# Patient Record
Sex: Female | Born: 1975
Health system: Southern US, Community
[De-identification: ages and names within clinical notes are randomized; demographics above are authoritative.]

## PROBLEM LIST (undated history)

## (undated) ENCOUNTER — Inpatient Hospital Stay (HOSPITAL_COMMUNITY): Payer: Self-pay

## (undated) DIAGNOSIS — K0889 Other specified disorders of teeth and supporting structures: Secondary | ICD-10-CM

## (undated) DIAGNOSIS — M25461 Effusion, right knee: Secondary | ICD-10-CM

## (undated) DIAGNOSIS — N200 Calculus of kidney: Secondary | ICD-10-CM

## (undated) DIAGNOSIS — O34219 Maternal care for unspecified type scar from previous cesarean delivery: Secondary | ICD-10-CM

## (undated) DIAGNOSIS — I8393 Asymptomatic varicose veins of bilateral lower extremities: Secondary | ICD-10-CM

## (undated) DIAGNOSIS — Z5189 Encounter for other specified aftercare: Secondary | ICD-10-CM

## (undated) DIAGNOSIS — S83289A Other tear of lateral meniscus, current injury, unspecified knee, initial encounter: Secondary | ICD-10-CM

## (undated) HISTORY — DX: Asymptomatic varicose veins of bilateral lower extremities: I83.93

## (undated) HISTORY — DX: Effusion, right knee: M25.461

## (undated) HISTORY — DX: Other specified disorders of teeth and supporting structures: K08.89

## (undated) HISTORY — DX: Other tear of lateral meniscus, current injury, unspecified knee, initial encounter: S83.289A

## (undated) HISTORY — DX: Calculus of kidney: N20.0

## (undated) HISTORY — PX: APPENDECTOMY: SHX54

---

## 2000-10-23 ENCOUNTER — Emergency Department (HOSPITAL_COMMUNITY): Admission: EM | Admit: 2000-10-23 | Discharge: 2000-10-23 | Payer: Self-pay | Admitting: Emergency Medicine

## 2002-11-08 ENCOUNTER — Encounter: Payer: Self-pay | Admitting: Emergency Medicine

## 2002-11-08 ENCOUNTER — Ambulatory Visit (HOSPITAL_COMMUNITY): Admission: AD | Admit: 2002-11-08 | Discharge: 2002-11-08 | Payer: Self-pay | Admitting: Gynecology

## 2002-11-08 ENCOUNTER — Encounter (INDEPENDENT_AMBULATORY_CARE_PROVIDER_SITE_OTHER): Payer: Self-pay | Admitting: *Deleted

## 2003-05-22 ENCOUNTER — Inpatient Hospital Stay (HOSPITAL_COMMUNITY): Admission: AD | Admit: 2003-05-22 | Discharge: 2003-05-22 | Payer: Self-pay | Admitting: Obstetrics and Gynecology

## 2003-06-03 ENCOUNTER — Inpatient Hospital Stay (HOSPITAL_COMMUNITY): Admission: AD | Admit: 2003-06-03 | Discharge: 2003-06-03 | Payer: Self-pay | Admitting: Obstetrics and Gynecology

## 2003-07-26 ENCOUNTER — Inpatient Hospital Stay (HOSPITAL_COMMUNITY): Admission: AD | Admit: 2003-07-26 | Discharge: 2003-07-26 | Payer: Self-pay | Admitting: *Deleted

## 2003-07-28 ENCOUNTER — Encounter (INDEPENDENT_AMBULATORY_CARE_PROVIDER_SITE_OTHER): Payer: Self-pay | Admitting: Specialist

## 2003-07-28 ENCOUNTER — Inpatient Hospital Stay (HOSPITAL_COMMUNITY): Admission: AD | Admit: 2003-07-28 | Discharge: 2003-07-28 | Payer: Self-pay | Admitting: *Deleted

## 2003-07-30 ENCOUNTER — Inpatient Hospital Stay (HOSPITAL_COMMUNITY): Admission: AD | Admit: 2003-07-30 | Discharge: 2003-07-30 | Payer: Self-pay | Admitting: Family Medicine

## 2003-08-04 ENCOUNTER — Encounter: Admission: RE | Admit: 2003-08-04 | Discharge: 2003-08-04 | Payer: Self-pay | Admitting: *Deleted

## 2003-08-26 ENCOUNTER — Encounter: Admission: RE | Admit: 2003-08-26 | Discharge: 2003-08-26 | Payer: Self-pay | Admitting: *Deleted

## 2003-08-27 ENCOUNTER — Ambulatory Visit (HOSPITAL_COMMUNITY): Admission: RE | Admit: 2003-08-27 | Discharge: 2003-08-27 | Payer: Self-pay | Admitting: *Deleted

## 2003-09-09 ENCOUNTER — Encounter: Admission: RE | Admit: 2003-09-09 | Discharge: 2003-09-09 | Payer: Self-pay | Admitting: *Deleted

## 2003-09-23 ENCOUNTER — Encounter: Admission: RE | Admit: 2003-09-23 | Discharge: 2003-09-23 | Payer: Self-pay | Admitting: *Deleted

## 2003-10-07 ENCOUNTER — Encounter: Admission: RE | Admit: 2003-10-07 | Discharge: 2003-10-07 | Payer: Self-pay | Admitting: *Deleted

## 2003-10-21 ENCOUNTER — Encounter: Admission: RE | Admit: 2003-10-21 | Discharge: 2003-10-21 | Payer: Self-pay | Admitting: *Deleted

## 2003-11-04 ENCOUNTER — Encounter: Admission: RE | Admit: 2003-11-04 | Discharge: 2003-11-04 | Payer: Self-pay | Admitting: *Deleted

## 2003-11-18 ENCOUNTER — Encounter: Admission: RE | Admit: 2003-11-18 | Discharge: 2003-11-18 | Payer: Self-pay | Admitting: *Deleted

## 2003-12-02 ENCOUNTER — Encounter: Admission: RE | Admit: 2003-12-02 | Discharge: 2003-12-02 | Payer: Self-pay | Admitting: *Deleted

## 2003-12-02 ENCOUNTER — Inpatient Hospital Stay (HOSPITAL_COMMUNITY): Admission: AD | Admit: 2003-12-02 | Discharge: 2003-12-04 | Payer: Self-pay | Admitting: Obstetrics and Gynecology

## 2003-12-09 ENCOUNTER — Encounter: Admission: RE | Admit: 2003-12-09 | Discharge: 2003-12-09 | Payer: Self-pay | Admitting: *Deleted

## 2003-12-16 ENCOUNTER — Encounter: Admission: RE | Admit: 2003-12-16 | Discharge: 2003-12-16 | Payer: Self-pay | Admitting: Obstetrics and Gynecology

## 2003-12-22 ENCOUNTER — Inpatient Hospital Stay (HOSPITAL_COMMUNITY): Admission: AD | Admit: 2003-12-22 | Discharge: 2003-12-24 | Payer: Self-pay | Admitting: *Deleted

## 2006-08-21 ENCOUNTER — Ambulatory Visit: Payer: Self-pay | Admitting: Internal Medicine

## 2006-08-23 ENCOUNTER — Ambulatory Visit (HOSPITAL_COMMUNITY): Admission: RE | Admit: 2006-08-23 | Discharge: 2006-08-23 | Payer: Self-pay | Admitting: Internal Medicine

## 2006-08-24 ENCOUNTER — Ambulatory Visit: Payer: Self-pay | Admitting: Internal Medicine

## 2006-08-24 ENCOUNTER — Ambulatory Visit: Payer: Self-pay | Admitting: *Deleted

## 2006-09-11 ENCOUNTER — Ambulatory Visit: Payer: Self-pay | Admitting: Internal Medicine

## 2006-09-24 ENCOUNTER — Ambulatory Visit: Payer: Self-pay | Admitting: Family Medicine

## 2006-09-26 ENCOUNTER — Ambulatory Visit (HOSPITAL_COMMUNITY): Admission: RE | Admit: 2006-09-26 | Discharge: 2006-09-26 | Payer: Self-pay | Admitting: Internal Medicine

## 2006-09-28 ENCOUNTER — Ambulatory Visit: Payer: Self-pay | Admitting: Internal Medicine

## 2006-11-19 ENCOUNTER — Ambulatory Visit: Admission: RE | Admit: 2006-11-19 | Discharge: 2006-11-19 | Payer: Self-pay | Admitting: Urology

## 2006-12-21 ENCOUNTER — Encounter (INDEPENDENT_AMBULATORY_CARE_PROVIDER_SITE_OTHER): Payer: Self-pay | Admitting: Urology

## 2006-12-21 ENCOUNTER — Inpatient Hospital Stay (HOSPITAL_COMMUNITY): Admission: RE | Admit: 2006-12-21 | Discharge: 2006-12-23 | Payer: Self-pay | Admitting: Urology

## 2006-12-21 HISTORY — PX: NEPHROLITHOTOMY: SUR881

## 2006-12-27 ENCOUNTER — Ambulatory Visit (HOSPITAL_COMMUNITY): Admission: RE | Admit: 2006-12-27 | Discharge: 2006-12-27 | Payer: Self-pay | Admitting: Urology

## 2007-02-18 ENCOUNTER — Ambulatory Visit (HOSPITAL_COMMUNITY): Admission: RE | Admit: 2007-02-18 | Discharge: 2007-02-18 | Payer: Self-pay | Admitting: Urology

## 2007-02-20 ENCOUNTER — Inpatient Hospital Stay (HOSPITAL_COMMUNITY): Admission: RE | Admit: 2007-02-20 | Discharge: 2007-02-25 | Payer: Self-pay | Admitting: Urology

## 2007-02-20 HISTORY — PX: KIDNEY STONE SURGERY: SHX686

## 2007-02-27 ENCOUNTER — Ambulatory Visit (HOSPITAL_COMMUNITY): Admission: RE | Admit: 2007-02-27 | Discharge: 2007-02-27 | Payer: Self-pay | Admitting: Urology

## 2007-03-19 ENCOUNTER — Ambulatory Visit (HOSPITAL_COMMUNITY): Admission: RE | Admit: 2007-03-19 | Discharge: 2007-03-19 | Payer: Self-pay | Admitting: Urology

## 2007-03-19 HISTORY — PX: KIDNEY STONE SURGERY: SHX686

## 2007-03-27 ENCOUNTER — Encounter (INDEPENDENT_AMBULATORY_CARE_PROVIDER_SITE_OTHER): Payer: Self-pay | Admitting: *Deleted

## 2007-07-11 DIAGNOSIS — N200 Calculus of kidney: Secondary | ICD-10-CM

## 2007-07-11 DIAGNOSIS — Z5189 Encounter for other specified aftercare: Secondary | ICD-10-CM

## 2007-07-11 DIAGNOSIS — IMO0001 Reserved for inherently not codable concepts without codable children: Secondary | ICD-10-CM

## 2007-07-11 HISTORY — PX: KIDNEY SURGERY: SHX687

## 2007-07-11 HISTORY — DX: Encounter for other specified aftercare: Z51.89

## 2007-07-11 HISTORY — DX: Calculus of kidney: N20.0

## 2007-07-11 HISTORY — DX: Reserved for inherently not codable concepts without codable children: IMO0001

## 2007-09-06 ENCOUNTER — Ambulatory Visit: Payer: Self-pay | Admitting: Internal Medicine

## 2007-09-06 DIAGNOSIS — M25469 Effusion, unspecified knee: Secondary | ICD-10-CM | POA: Insufficient documentation

## 2007-09-06 LAB — CONVERTED CEMR LAB: Crystals, Fluid: NONE SEEN

## 2007-09-07 ENCOUNTER — Encounter (INDEPENDENT_AMBULATORY_CARE_PROVIDER_SITE_OTHER): Payer: Self-pay | Admitting: Internal Medicine

## 2007-09-10 ENCOUNTER — Ambulatory Visit (HOSPITAL_COMMUNITY): Admission: RE | Admit: 2007-09-10 | Discharge: 2007-09-10 | Payer: Self-pay | Admitting: Internal Medicine

## 2007-09-26 ENCOUNTER — Ambulatory Visit: Payer: Self-pay | Admitting: Internal Medicine

## 2007-09-26 DIAGNOSIS — K089 Disorder of teeth and supporting structures, unspecified: Secondary | ICD-10-CM | POA: Insufficient documentation

## 2007-10-03 ENCOUNTER — Ambulatory Visit: Payer: Self-pay | Admitting: Internal Medicine

## 2007-10-03 LAB — CONVERTED CEMR LAB
Anti Nuclear Antibody(ANA): NEGATIVE
Rhuematoid fact SerPl-aCnc: <20 intl units/mL (ref 0–20)

## 2007-11-05 ENCOUNTER — Ambulatory Visit: Payer: Self-pay | Admitting: Internal Medicine

## 2007-11-11 ENCOUNTER — Ambulatory Visit (HOSPITAL_COMMUNITY): Admission: RE | Admit: 2007-11-11 | Discharge: 2007-11-11 | Payer: Self-pay | Admitting: Internal Medicine

## 2007-12-17 ENCOUNTER — Ambulatory Visit: Payer: Self-pay | Admitting: Internal Medicine

## 2008-01-03 ENCOUNTER — Telehealth (INDEPENDENT_AMBULATORY_CARE_PROVIDER_SITE_OTHER): Payer: Self-pay | Admitting: Internal Medicine

## 2008-12-10 ENCOUNTER — Encounter (INDEPENDENT_AMBULATORY_CARE_PROVIDER_SITE_OTHER): Payer: Self-pay | Admitting: Internal Medicine

## 2008-12-27 IMAGING — US US RENAL
1 series · 14 of 25 positions shown · non-contrast
Comparison: none

CLINICAL DATA: Right flank pain.  History of kidney stones.
 RENAL/URINARY TRACT ULTRASOUND ? 08/23/06:
TECHNIQUE: Complete ultrasound examination of the urinary tract was performed including evaluation of the kidneys, renal collecting systems, and urinary bladder.

[Series 1: unknown · 0.33mm/px · 14 of 25 slices shown]
[im 1/25]
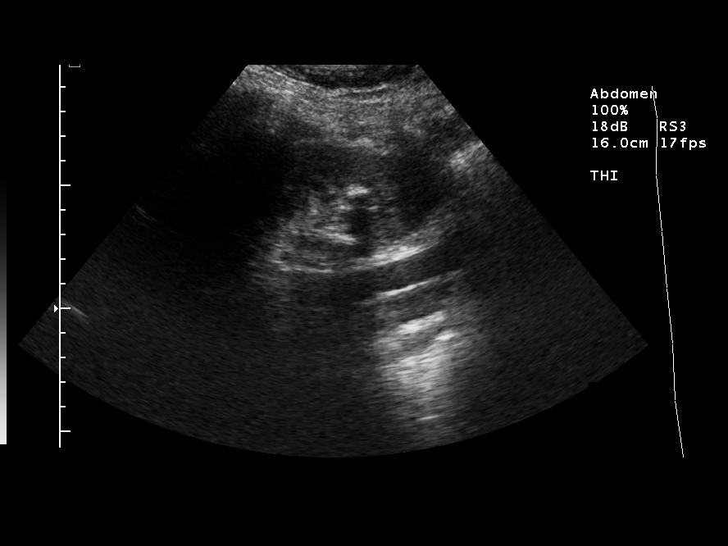
[im 3/25]
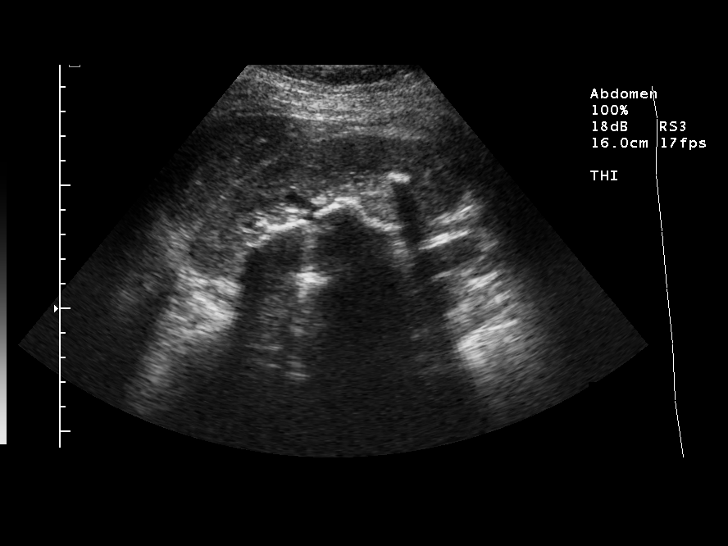
[im 5/25]
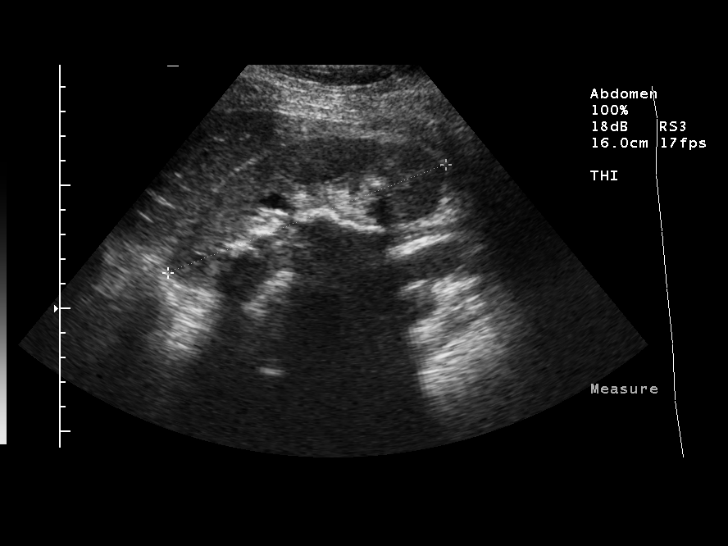
[im 7/25]
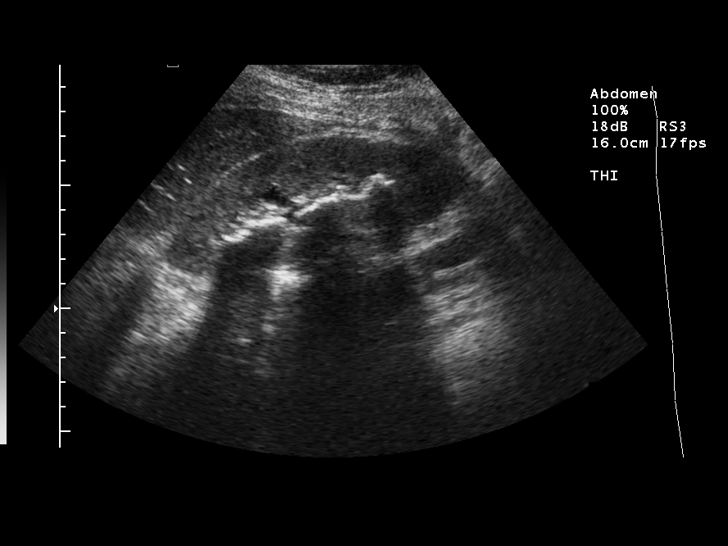
[im 9/25]
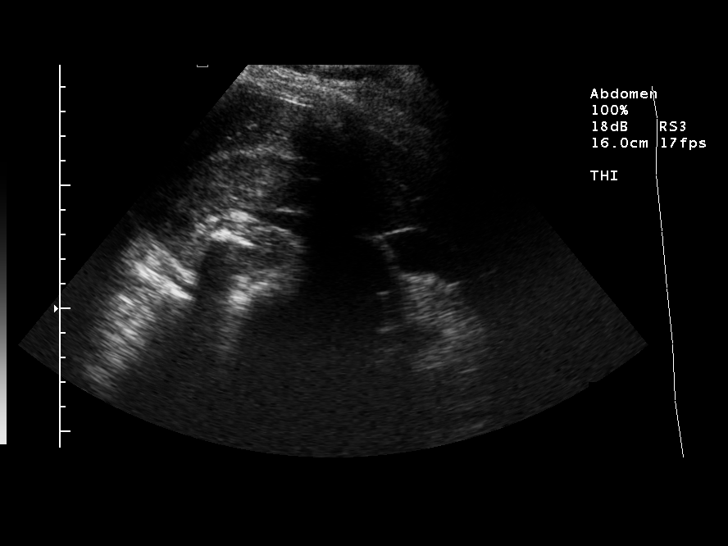
[im 10/25]
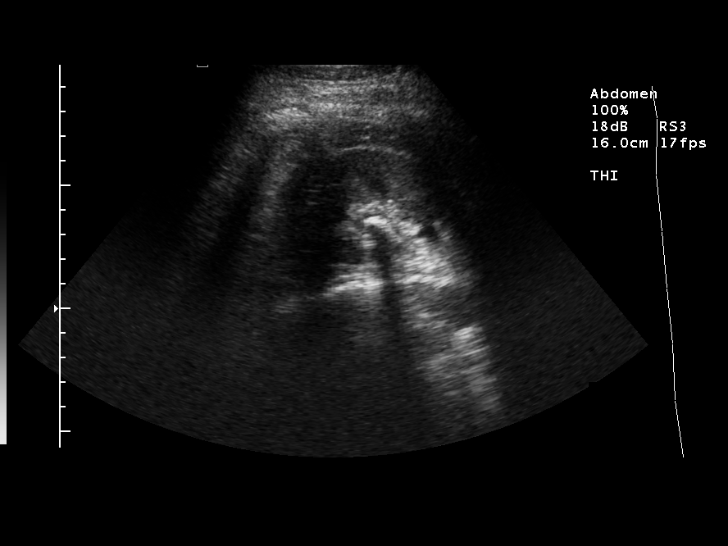
[im 12/25]
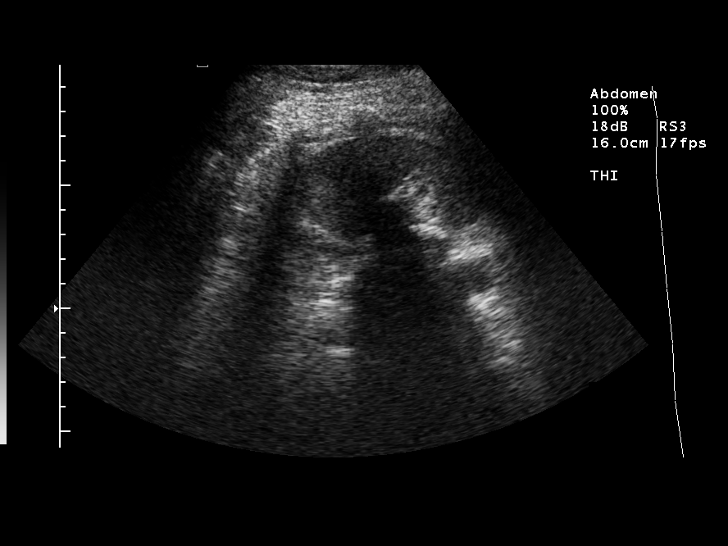
[im 14/25]
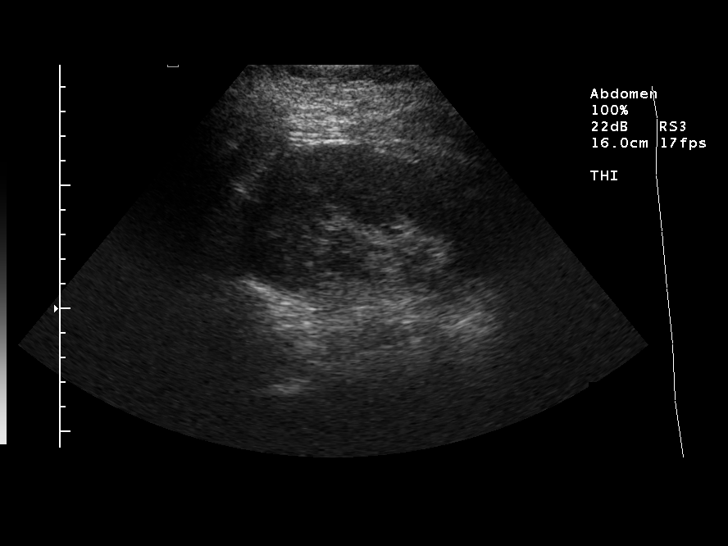
[im 16/25]
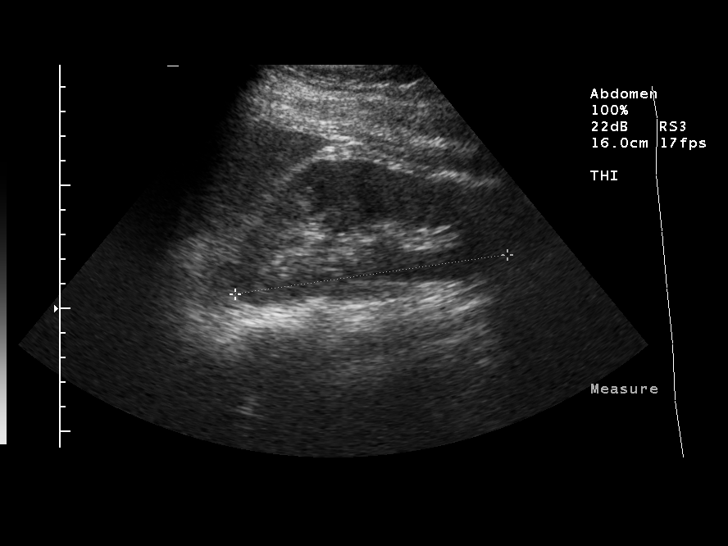
[im 17/25]
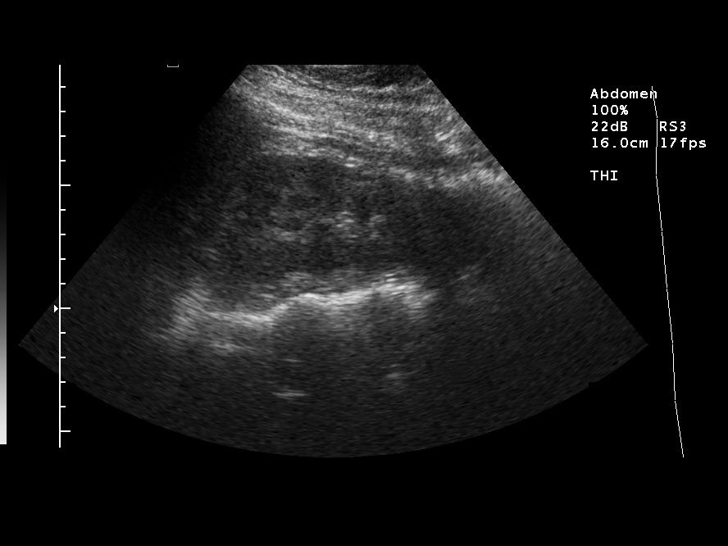
[im 19/25]
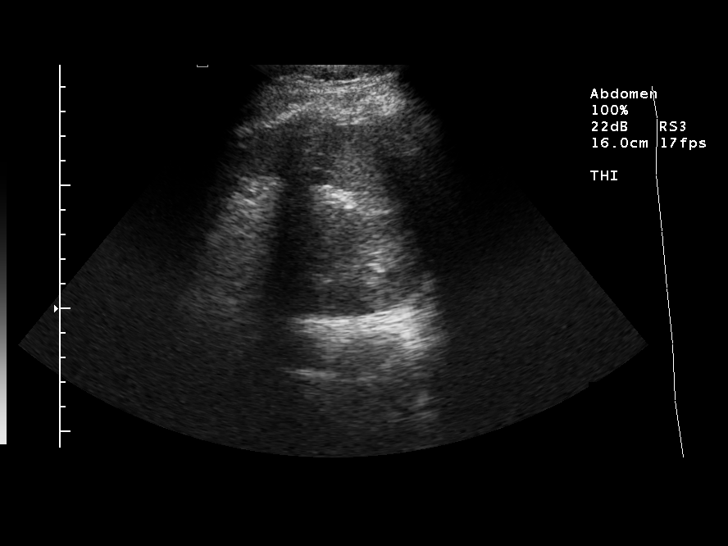
[im 21/25]
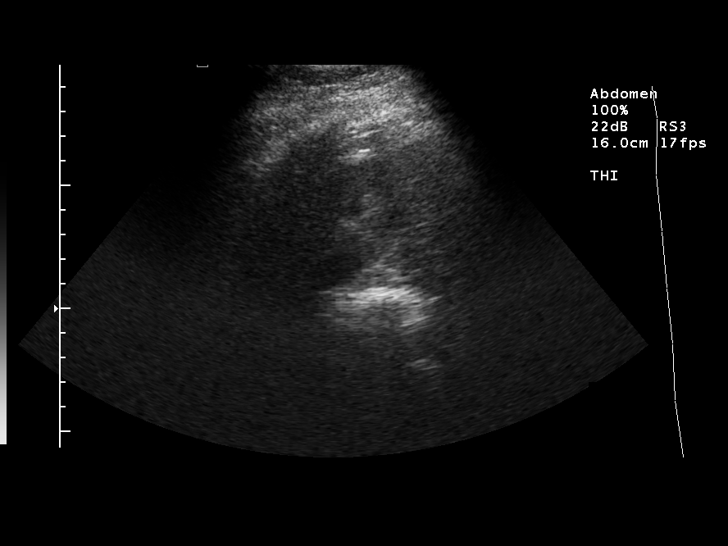
[im 23/25]
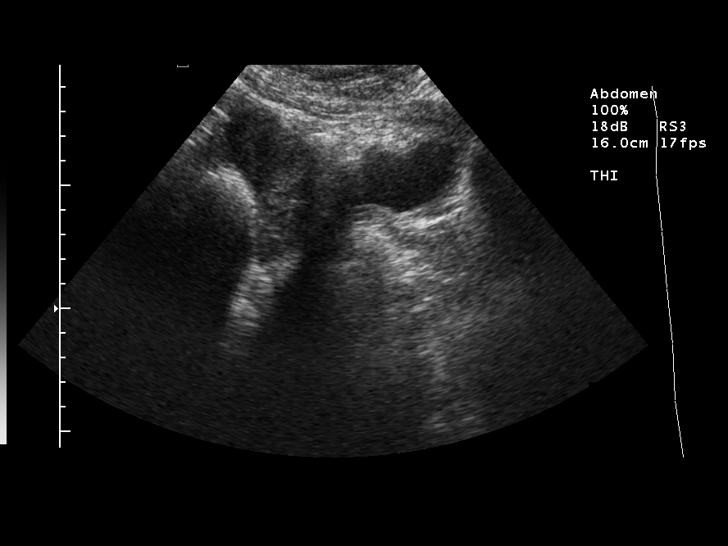
[im 25/25]
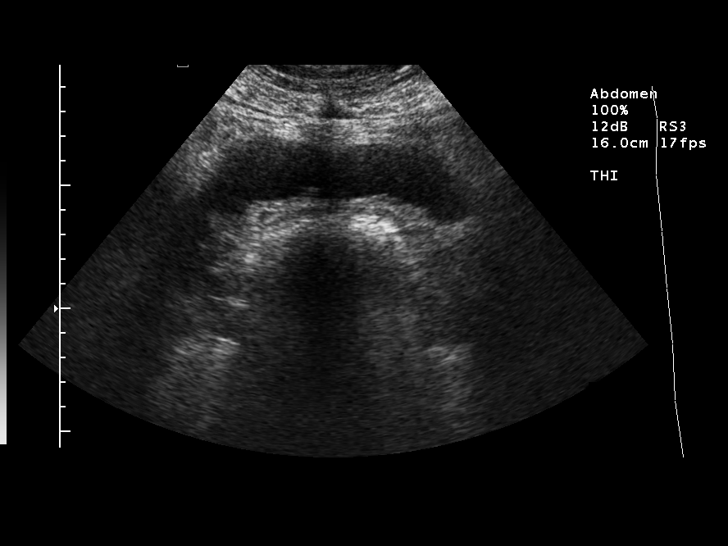

[14 of 25 positions shown; findings below may reference images not displayed]

FINDINGS: There is a 10 mm stone in the left lower pole which shadows. This is unchanged from the ultrasound of 12/02/03.  No other renal calculi are identified. There is no hydronephrosis or solid renal mass.  The right kidney measures 12.1 cm in length and the left kidney measures 11.1 cm in length.  The urinary bladder is normal.
IMPRESSION: 9 mm stone in the left lower pole, unchanged from 5221.  No hydronephrosis.

## 2009-03-25 IMAGING — XA IR FLUORO RM 0-60 MIN
1 series · 9 of 9 positions shown · IV contrast (agent unspecified)
Comparison: none

CLINICAL DATA: 31-year-old with staghorn calculus in the right kidney.  The patient was scheduled for a nephrolithotomy.
FLUORO ROOM FOR ATTEMPTED RIGHT PERCUTANEOUS NEPHROSTOMY ? 11/19/06:
Medications:  Versed 6 mg, fentanyl 150 mcg, Ancef 1 gram. 
Procedure:  The patient is non-English speaking, and informed consent was obtained using a translator.  The risks of the procedure were explained to the patient.  The patient was brought into the interventional room and placed in a prone position.  The right flank was prepped and draped in a sterile fashion.  Fluoroscopy demonstrated a large staghorn calculus involving the entire renal pelvis as well as the upper pole and lower pole.  The referring physician, Dr. Alia Tiger, requested access to the lower pole calculus.  The lower pole calix was targeted with fluoroscopy, and a 22-gauge needle was directed on top of the lower pole calix stone.  The renal collecting system was opacified.  However, we were unable to successfully navigate a 0.018 guidewire into the renal pelvis.  Multiple mandril wires and a glide wire were used for this procedure.  The lower pole calix was also accessed multiple times at different angles.  An AccuStick dilator set was advanced into the lower pole caliceal region after this area was dilated with contrast.  However, the catheter and a 0.035 glidewire could not be successfully navigated into the renal pelvis.  A 65-cm 5-French Kumpe catheter was also placed in the lower pole calix, but this could not be navigated into the renal pelvis.  Additional attempts were also performed with an 18-gauge needle using a 0.035 wire.  After 100 minutes of sedation and 34 minutes of fluoro time, the procedure was terminated.  Findings were discussed with the referring physician, Dr. Pavo.  Dr. Pavo stated that he preferred a lower pole calix and therefore, a midpole access was not attempted.  
The patient tolerated the procedure well without complication.

[Series 1000: run · 9 of 9 slices shown]
[im 1/9]
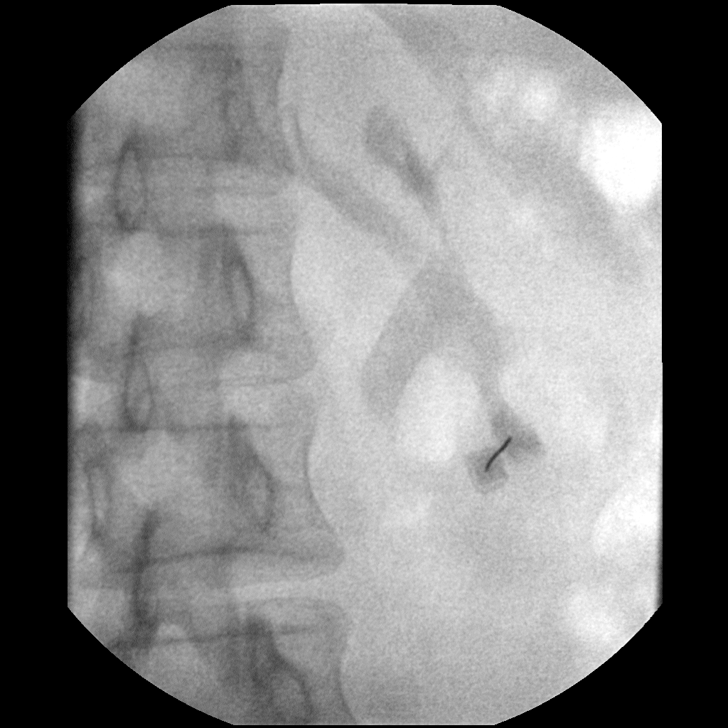
[im 2/9]
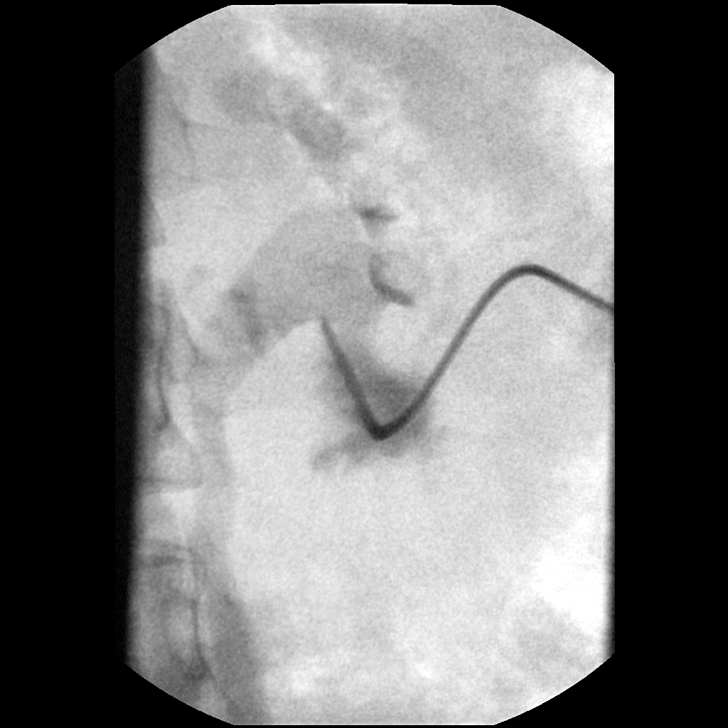
[im 3/9]
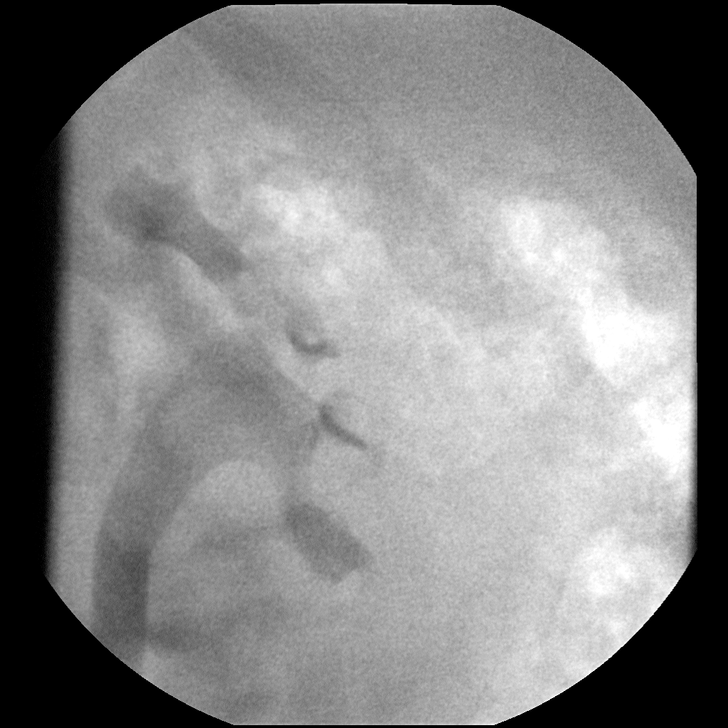
[im 4/9]
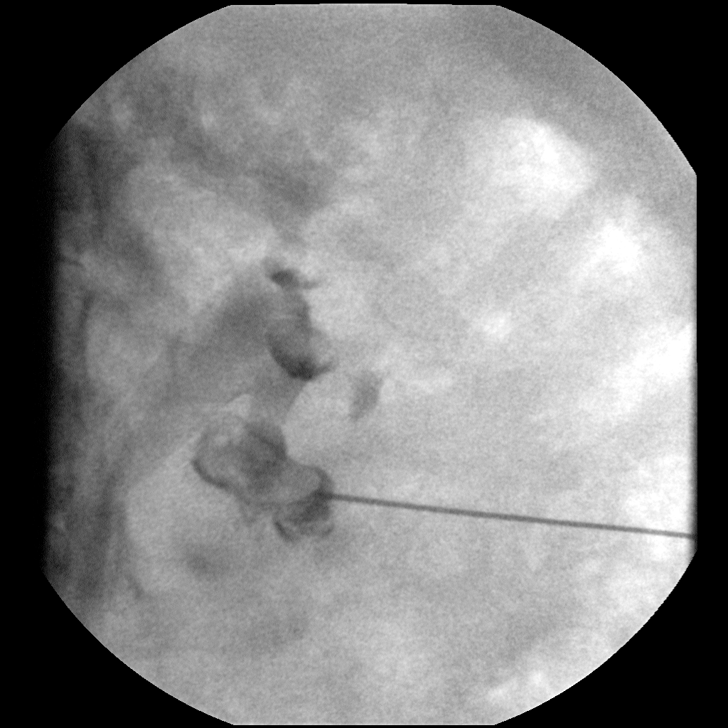
[im 5/9]
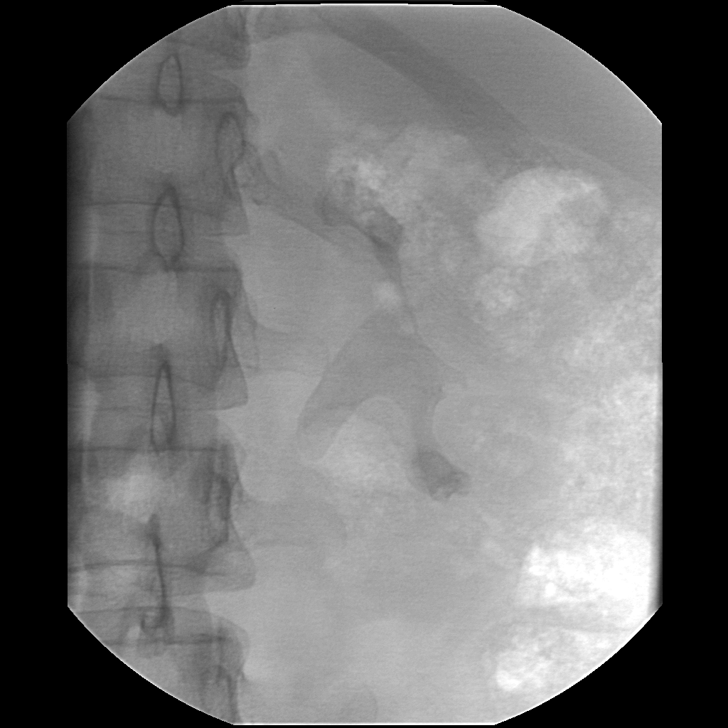
[im 6/9]
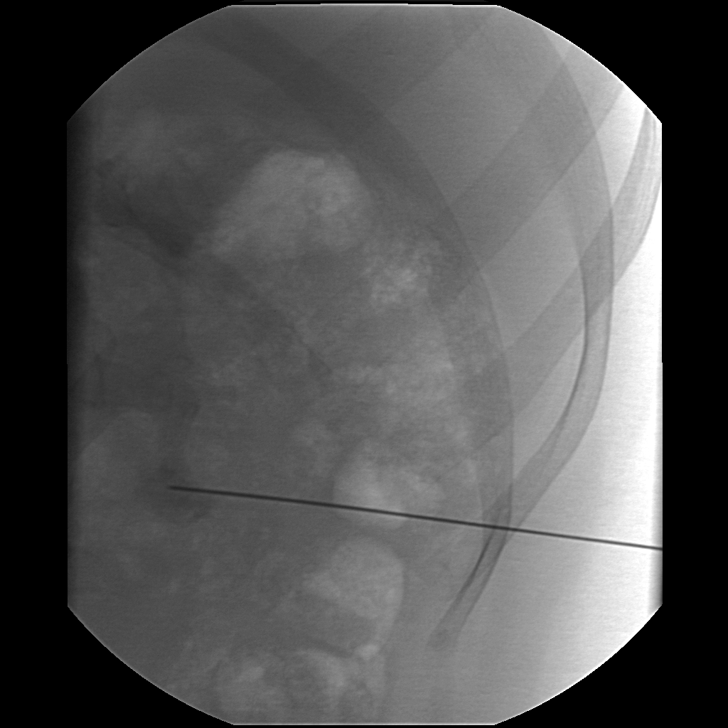
[im 7/9]
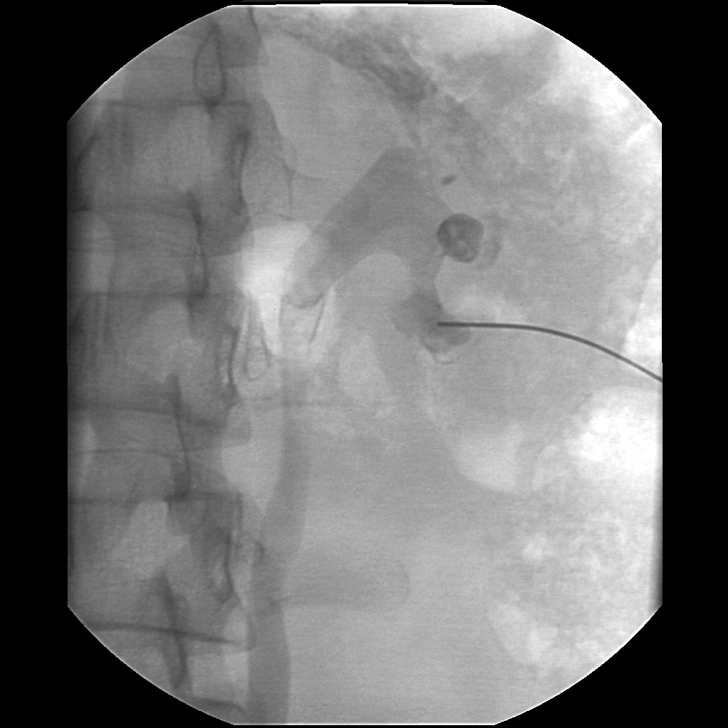
[im 8/9]
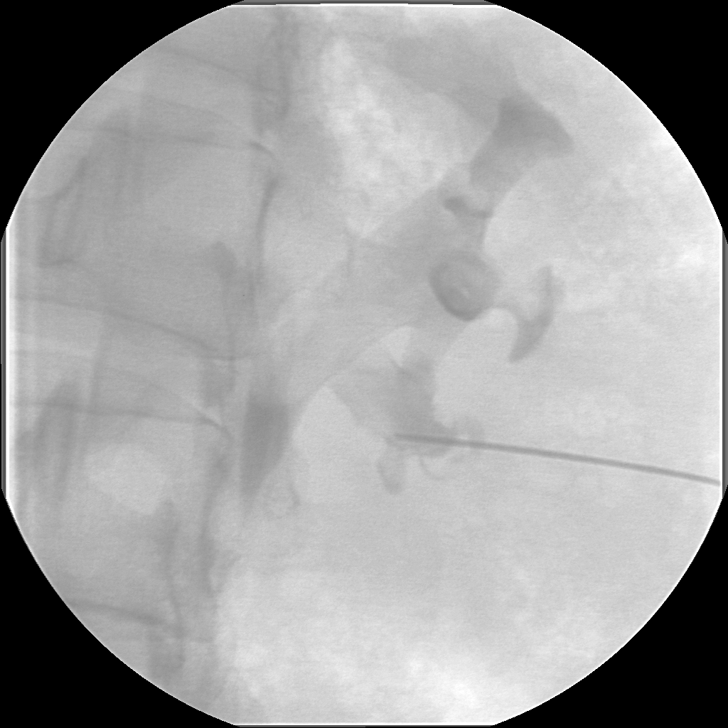
[im 9/9]
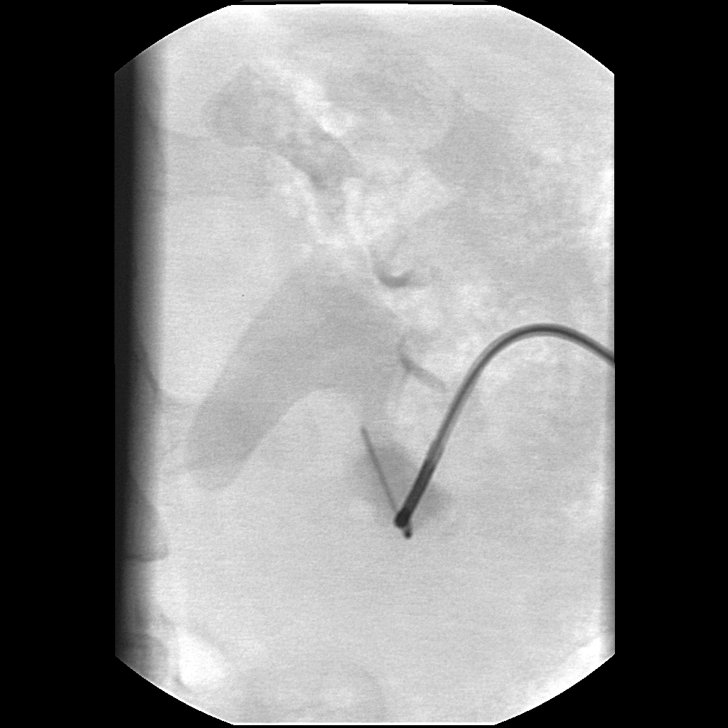

[9 of 9 positions shown; findings below may reference images not displayed]

IMPRESSION: Attempted placement of a right nephrostomy tube through the lower pole staghorn calculus.  catheter could not be successfully advanced past the renal pelvis.  The procedure was terminated and the patient is going to be scheduled for a second attempt at a later time.

## 2010-07-31 ENCOUNTER — Encounter: Payer: Self-pay | Admitting: Urology

## 2010-08-23 ENCOUNTER — Encounter: Payer: Self-pay | Admitting: Internal Medicine

## 2010-08-23 ENCOUNTER — Other Ambulatory Visit (HOSPITAL_COMMUNITY): Payer: Self-pay | Admitting: Internal Medicine

## 2010-08-23 ENCOUNTER — Encounter (INDEPENDENT_AMBULATORY_CARE_PROVIDER_SITE_OTHER): Payer: Self-pay | Admitting: Internal Medicine

## 2010-08-23 ENCOUNTER — Ambulatory Visit (HOSPITAL_COMMUNITY)
Admission: RE | Admit: 2010-08-23 | Discharge: 2010-08-23 | Disposition: A | Discharge status: Home / Self Care | Payer: Self-pay | Source: Ambulatory Visit | Attending: Internal Medicine | Admitting: Internal Medicine

## 2010-08-23 DIAGNOSIS — M79609 Pain in unspecified limb: Secondary | ICD-10-CM | POA: Insufficient documentation

## 2010-08-23 DIAGNOSIS — M79605 Pain in left leg: Secondary | ICD-10-CM

## 2010-08-23 DIAGNOSIS — M1710 Unilateral primary osteoarthritis, unspecified knee: Secondary | ICD-10-CM | POA: Insufficient documentation

## 2010-08-23 DIAGNOSIS — IMO0002 Reserved for concepts with insufficient information to code with codable children: Secondary | ICD-10-CM | POA: Insufficient documentation

## 2010-08-23 DIAGNOSIS — M25569 Pain in unspecified knee: Secondary | ICD-10-CM | POA: Insufficient documentation

## 2010-08-29 ENCOUNTER — Telehealth (INDEPENDENT_AMBULATORY_CARE_PROVIDER_SITE_OTHER): Payer: Self-pay | Admitting: Internal Medicine

## 2010-08-31 NOTE — Assessment & Plan Note (Signed)
Summary: Left leg pain   Vital Signs:  Patient profile:   35 year old female Menstrual status:  regular LMP:     08/02/2010 Weight:      147.06 pounds BMI:     24.56 Temp:     97.8 degrees F oral Pulse rate:   66 / minute Pulse rhythm:   irregular Resp:     18 per minute BP sitting:   98 / 58  (left arm) Cuff size:   regular  Vitals Entered By: Hale Drone CMA (August 23, 2010 9:56 AM) CC: Having pain of the side of her left knee x3 weeks. Hurts to walk on it and putting pressure. Was seen here a few years ago for the same reason on her right knee and was given an injection making her pain go away. Is Patient Diabetic? No Pain Assessment Patient in pain? yes     Location: knee  Does patient need assistance? Functional Status Self care Ambulation Normal LMP (date): 08/02/2010 LMP - Character: normal     Menstrual Status regular Enter LMP: 08/02/2010   CC:  Having pain of the side of her left knee x3 weeks. Hurts to walk on it and putting pressure. Was seen here a few years ago for the same reason on her right knee and was given an injection making her pain go away..  History of Present Illness: 1.  Left knee pain:  Started with pain 2-3 weeks ago.  No history of injury.  Pain has gradually increased over time.  Notes more when walking after sitting for long time.  Also when standing for a long time.  Also notes when going up stairs. Notes also with deep knee bending and kneeling.  No swelling or redness of knee of knee.  Feels like something is caught in her knee.    Current Medications (verified): 1)  Naproxen Dr 500 Mg  Tbec (Naproxen) .Marland Kitchen.. 1 Tab By Mouth Two Times A Day With Food  Allergies (verified): No Known Drug Allergies  Physical Exam  Extremities:  Left Knee:  no effusion.  NT over joint margin.  Tenderness really just limited to over fibular head.  Does not displace with pressure or movement.  Unable to fully flex knee due to pain over fibular head.  No  overlying swelling or erythema No crepitation with movement of knee or compression over patella.  No cruciate or collateral ligament laxity or tenderness with stress manuevers.   Impression & Recommendations:  Problem # 1:  LEG PAIN, LEFT (ICD-729.5)  Appears related to fibular head  Orders: Diagnostic X-Ray/Fluoroscopy (Diagnostic X-Ray/Flu) Physical Therapy Referral (PT)  Complete Medication List: 1)  Naproxen Dr 500 Mg Tbec (Naproxen) .Marland Kitchen.. 1 tab by mouth two times a day with food  Other Orders: Flu Vaccine 93yrs + (16109) Admin 1st Vaccine (60454)  Patient Instructions: 1)  Ibuprofen 200 mg  2-4 pastillas cada 6 horas con comida. 2)  Follow up with Dr. Delrae Alfred in 4 months --left leg pain   Orders Added: 1)  Flu Vaccine 61yrs + [90658] 2)  Admin 1st Vaccine [90471] 3)  Diagnostic X-Ray/Fluoroscopy [Diagnostic X-Ray/Flu] 4)  Est. Patient Level III [09811] 5)  Physical Therapy Referral [PT]   Immunizations Administered:  Influenza Vaccine # 1:    Vaccine Type: Fluvax 3+    Site: left deltoid    Mfr: GlaxoSmithKline    Dose: 0.5 ml    Route: IM    Given by: Hale Drone CMA  Exp. Date: 01/07/2011    Lot #: [id number]    VIS given: 02/01/10 version given August 23, 2010.  Flu Vaccine Consent Questions:    Do you have a history of severe allergic reactions to this vaccine? no    Any prior history of allergic reactions to egg and/or gelatin? no    Do you have a sensitivity to the preservative Thimersol? no    Do you have a past history of Guillan-Barre Syndrome? no    Do you currently have an acute febrile illness? no    Have you ever had a severe reaction to latex? no    Vaccine information given and explained to patient? yes    Are you currently pregnant? no   Immunizations Administered:  Influenza Vaccine # 1:    Vaccine Type: Fluvax 3+    Site: left deltoid    Mfr: GlaxoSmithKline    Dose: 0.5 ml    Route: IM    Given by: Hale Drone CMA    Exp. Date:  01/07/2011    Lot #: [id number]    VIS given: 02/01/10 version given August 23, 2010.

## 2010-09-01 ENCOUNTER — Ambulatory Visit: Payer: Self-pay | Attending: Internal Medicine | Admitting: Physical Therapy

## 2010-09-01 DIAGNOSIS — IMO0001 Reserved for inherently not codable concepts without codable children: Secondary | ICD-10-CM | POA: Insufficient documentation

## 2010-09-01 DIAGNOSIS — M25569 Pain in unspecified knee: Secondary | ICD-10-CM | POA: Insufficient documentation

## 2010-09-05 ENCOUNTER — Encounter (INDEPENDENT_AMBULATORY_CARE_PROVIDER_SITE_OTHER): Payer: Self-pay | Admitting: Internal Medicine

## 2010-09-06 NOTE — Progress Notes (Signed)
Summary: PT RETURNING YOUR CALL  Phone Note Outgoing Call   Summary of Call: Ramon--please call pt. and let her know her knee xrays showed some mild arthritis, but not where her pain is--let's see how PT works to improve pain. Initial call taken by: Julieanne Manson MD,  August 29, 2010 4:21 PM  Follow-up for Phone Call        LM on [phone] -- Hale Drone [facility]  August 30, 2010 10:35 AM   *PT RETURNING YOUR [facility] BACK @ [phone] THANK YOU .Marland KitchenCheryll Dessert  August 30, 2010 3:25 PM  *PT CALL AGAIN I PAGE RAMON TWICE AND [place] HELP ME TO TELL THE PT THE RESULTS AND ALSO THE PT HAVE AN APPT TODAY WITH PT . PT AWARE OF THE RESULTS .Marland KitchenCheryll Dessert  September 01, 2010 12:59 PM

## 2010-09-07 ENCOUNTER — Ambulatory Visit: Payer: Self-pay | Admitting: Physical Therapy

## 2010-09-12 ENCOUNTER — Ambulatory Visit: Payer: Self-pay | Attending: Internal Medicine | Admitting: Physical Therapy

## 2010-09-12 DIAGNOSIS — M25569 Pain in unspecified knee: Secondary | ICD-10-CM | POA: Insufficient documentation

## 2010-09-12 DIAGNOSIS — IMO0001 Reserved for inherently not codable concepts without codable children: Secondary | ICD-10-CM | POA: Insufficient documentation

## 2010-09-15 NOTE — Miscellaneous (Signed)
Summary: Rehab Report//INITIAL SUMMARY  Rehab Report//INITIAL SUMMARY   Imported By: Arta Bruce 09/05/2010 12:53:01  _____________________________________________________________________  External Attachment:    Type:   Image     Comment:   External Document

## 2010-09-20 ENCOUNTER — Encounter: Payer: Self-pay | Admitting: Physical Therapy

## 2010-09-22 ENCOUNTER — Ambulatory Visit: Payer: Self-pay | Admitting: Physical Therapy

## 2010-09-23 ENCOUNTER — Ambulatory Visit: Payer: Self-pay | Admitting: Physical Therapy

## 2010-09-27 ENCOUNTER — Ambulatory Visit: Payer: Self-pay | Admitting: Rehabilitative and Restorative Service Providers"

## 2010-10-04 ENCOUNTER — Encounter (INDEPENDENT_AMBULATORY_CARE_PROVIDER_SITE_OTHER): Payer: Self-pay | Admitting: Internal Medicine

## 2010-10-05 ENCOUNTER — Ambulatory Visit: Payer: Self-pay | Admitting: Physical Therapy

## 2010-10-06 ENCOUNTER — Ambulatory Visit: Payer: Self-pay | Admitting: Physical Therapy

## 2010-10-11 NOTE — Miscellaneous (Signed)
Summary: Rehab Report/RENEWAL SUMMARY  Rehab Report/RENEWAL SUMMARY   Imported By: Arta Bruce 10/04/2010 10:35:00  _____________________________________________________________________  External Attachment:    Type:   Image     Comment:   External Document

## 2010-11-22 NOTE — H&P (Signed)
NAME:  Melinda Spears, Melinda Spears         ACCOUNT NO.:  [id number]   MEDICAL RECORD NO.:  [id number]          PATIENT TYPE:  INP   LOCATION:  1413                         FACILITY:  [facility]   PHYSICIAN:  Lindaann Slough, M.D.  DATE OF BIRTH:  [birth year]   DATE OF ADMISSION:  12/21/2006  DATE OF DISCHARGE:                              HISTORY & PHYSICAL   CHIEF COMPLAINT:  Right kidney stone.   HISTORY OF PRESENT ILLNESS:  The patient is a 35 year old female who has  been complaining of right flank pain for about 6 months now.  She also  had been having chills, fever and headaches on and off.  She was treated  about three times since January for urinary tract infection.  A CT scan  on September 26, 2006 showed a right staghorn calculus.  She was scheduled  for percutaneous nephrolithotomy about a month ago, but a proper access  to the stone could not be obtained.  The procedure was canceled, and she  is rescheduled today for the procedure.  She had a right nephrostomy  done this morning and she is admitted now for right percutaneous  nephrolithotomy.   PAST MEDICAL HISTORY:  Positive for kidney stones.   PAST SURGICAL HISTORY:  C-section and appendectomy.   MEDICATIONS:  She is on no medication at this time.   ALLERGIES:  NO KNOWN DRUG ALLERGIES.   SOCIAL HISTORY:  She is married, has 2 children.  Does not smoke nor  drink.   FAMILY HISTORY:  Not contributory.   REVIEW OF SYSTEMS:  Positive for chills fever, flank pain, frequency,  urgency, burning on urination at times.  Everything else is negative.   PHYSICAL EXAMINATION:  This is a well-developed 35 year old female who  is in no acute distress at this time.  She is alert and oriented.  Blood pressure is 114/65, pulse 85, respirations 24.  Skin is warm and dry.  Her head is normal, pupils are equal and reactive to light and  accommodation.  Ears, nose and throat within normal limits.  Neck:  Supple.  No cervical lymph nodes, no  thyromegaly.  Chest:  Symmetrical.  Lungs:  Fully expanded and clear to percussion and auscultation.  Heart:  Regular rhythm.  Abdomen:  Soft, nondistended, nontender.  She has a right nephrostomy  catheter in place and she has mild right  CVA tenderness at this time.  Kidneys are not palpable.  Bladder is not  distended.  She has no inguinal hernia, no inguinal adenopathy   IMPRESSION:  Right staghorn calculus and history of urinary tract  infection.      Lindaann Slough, M.D.  Electronically Signed     MN/MEDQ  D:  12/21/2006  T:  12/21/2006  Job:  [id number]

## 2010-11-22 NOTE — H&P (Signed)
NAME:  Melinda Spears, Melinda Spears         ACCOUNT NO.:  [id number]   MEDICAL RECORD NO.:  [id number]          PATIENT TYPE:  AMB   LOCATION:  XRAY                         FACILITY:  [facility]   PHYSICIAN:  Lindaann Slough, M.D.  DATE OF BIRTH:  [birth year]   DATE OF ADMISSION:  02/20/2007  DATE OF DISCHARGE:  02/18/2007                              HISTORY & PHYSICAL   CHIEF COMPLAINT:  Right kidney stones.   HISTORY OF PRESENT ILLNESS:  The patient is a 35 year old female who had  right percutaneous nephrolithotomy on December 21, 2006 for right staghorn  calculus.  She has remaining stone fragments in the upper and lower  poles of the right kidney. A nephrostomy catheter was left in place  after the nephrolithotomy.  It was replaced 2 days ago and she also had  another nephrostomy tube inserted in the lower pole of the right kidney  for better access to the lower pole and upper pole.  She is scheduled  today for second look and removal of remaining stone fragments.   PAST MEDICAL HISTORY:  Positive for kidney stones.   PAST SURGICAL HISTORY:  She had PCNL on December 21, 2006 and she had a C-  section and appendectomy in the past.   MEDICATIONS:  Cipro 500 mg twice a day.   ALLERGIES:  NO KNOWN DRUG ALLERGIES.   SOCIAL HISTORY:  She is married.  Has 2 children.  Does not smoke or  drink.   FAMILY HISTORY:  Noncontributory.   REVIEW OF SYSTEMS:  As noted in the HPI and is otherwise negative.   PHYSICAL EXAMINATION:  GENERAL:  This is a well-developed 35 year old  female in no acute distress.  She is alert and oriented.  VITAL SIGNS:  Blood pressure is 107/74, pulse 92, respirations 18,  temperature 99.2.  HEAD:  Her head is normal.  She has pink conjunctivae.  EARS, NOSE & THROAT:  Within normal limits.  NECK:  Supple.  No cervical lymph nodes.  No thyromegaly.  CHEST:  Symmetrical.  LUNGS:  Full expanded and clear to auscultation and percussion.  HEART:  Regular rhythm.  ABDOMEN:  Soft,  nondistended.  She has right nephrostomy catheters.  Liver, spleen and kidneys are not palpable.  Bladder is not distended.  She has no inguinal hernia.  Bowel sounds are normal.   IMPRESSION:  Remaining stone fragments upper and lower poles right  kidney, status post percutaneous nephrolithotomy right staghorn  calculus.   PLAN:  PCNL right kidney stones.      Lindaann Slough, M.D.  Electronically Signed     MN/MEDQ  D:  02/20/2007  T:  02/20/2007  Job:  [id number]

## 2010-11-22 NOTE — Op Note (Signed)
NAME:  Melinda Spears, Melinda Spears         ACCOUNT NO.:  [id number]   MEDICAL RECORD NO.:  [id number]          PATIENT TYPE:  INP   LOCATION:  1413                         FACILITY:  [facility]   PHYSICIAN:  Lindaann Slough, M.D.  DATE OF BIRTH:  02/22/1976   DATE OF PROCEDURE:  12/21/2006  DATE OF DISCHARGE:                               OPERATIVE REPORT   PREOPERATIVE DIAGNOSIS:  Right staghorn calculus.   POSTOPERATIVE DIAGNOSIS:  Right staghorn calculus.   PROCEDURE DONE:  Right percutaneous nephrolithotomy.   SURGEON:  Danae Chen, MD.   ANESTHESIA:  General.   INDICATION:  The patient is a 35 year old female with a history of  recurrent urinary tract infection.  CT scan for workup showed a right  staghorn calculus.  Treatment options were discussed with the patient,  and she agrees to have a right percutaneous nephrolithotomy.  The  procedure with the risks and benefits has been discussed in detail with  the patient.  She understands and she gave informed consent.  An attempt  to do a nephrostomy about a month ago was unsuccessful, could not get  access to the renal pelvis.  Dr. Bonnielee Haff performed a right nephrostomy this  morning, and she is scheduled now for right percutaneous  nephrolithotomy.   Under general anesthesia, the patient was prepped and draped and placed  in the prone position.  The nephrostomy tract was dilated by Dr. Bonnielee Haff,  then the nephroscope was inserted through the nephrostomy tract and an  Amplatz sheath.  The stone was visualized.  A segment of the stone is in  the lower pole of the kidney, and another part of the stone is in the  upper pole.  The fragment in the renal pelvis was clearly visualized.  With the Lithoclast, the stone was fragmented and the fragments were  extracted with a three-prong forceps, and through the suction.  An  attempt was made to reach the upper pole, however, because of bleeding,  it was difficult to get access to the upper pole calix.   There is a  fragment remaining in the lower pole calix, and that fragment could not  be accessed either.  Then, a nephroureteral catheter was passed over the  safety wire and placed  into the ureter.  The working wire was used to  place a #22-French Councill  catheter in the renal pelvis.  Contrast was  then injected toward the nephrostomy catheter, and there was no evidence  of extravasation of contrast.  The Amplatz sheath was then removed.  The  nephrostomy catheter was connected to straight drainage.  The plan is to  reassess the location of the stone fragments and decide whether we  should proceed with ESL or a second look.   The patient tolerated the procedure well and left the OR in satisfactory  condition to Post Anesthesia Care Unit.   ESTIMATED BLOOD LOSS:  300 ml.      Lindaann Slough, M.D.  Electronically Signed     MN/MEDQ  D:  12/21/2006  T:  12/21/2006  Job:  [id number]

## 2010-11-22 NOTE — Op Note (Signed)
NAME:  Melinda Spears, Melinda Spears         ACCOUNT NO.:  [id number]   MEDICAL RECORD NO.:  [id number]          PATIENT TYPE:  INP   LOCATION:  1417                         FACILITY:  [facility]   PHYSICIAN:  Lindaann Slough, M.D.  DATE OF BIRTH:  20-Feb-1976   DATE OF PROCEDURE:  02/20/2007  DATE OF DISCHARGE:                               OPERATIVE REPORT   PREOPERATIVE DIAGNOSIS:  Right renal calculi.   POSTOPERATIVE DIAGNOSIS:  Right renal calculi.   PROCEDURE:  Right percutaneous nephrolithotomy.   SURGEON:  Danae Chen, M.D.  Arthur A. Hoss, M.D.   ANESTHESIA:  General.   INDICATIONS:  The patient is 35 year old female who had percutaneous  nephrolithotomy on December 21, 2006, for right staghorn calculi.  She had  two large remaining stone fragments, one in the upper pole, another one  in the lower pole of the kidney.  The nephrostomy catheter was left in  place.  The catheter came out two days ago and it was replaced and, at  the same time, an access to the lower pole of the kidney was done.  She  is scheduled today for percutaneous nephrolithotomy of the remaining  stone fragments.   DESCRIPTION OF PROCEDURE:  Under general anesthesia, the patient was  prepped and draped and placed in the prone position.  The nephrostomy  tract through the lower pole calix was dilated by Dr. Bonnielee Haff.  Then, an  Amplatz sheath was passed through the nephrostomy tract.  Two wires were  passed through the lower pole calix, one: a working wire, the other one:  a safety wire.  The nephrostomy tract to the middle pole of the kidney  was replaced with a guide wire.  The nephroscope was then passed through  the Amplatz sheath. The stone in the lower pole was at first visualized  and when an attempt was made  to use the lithoclast, it was difficult to  find the stone. After several attempts, it was decided to work on the  upper pole stone.   After dilation of the tract, the Amplatz sheath was passed in the  renal  pelvis and the nephroscope was then passed through the Amplatz sheath  and the stone in the upper pole calix was visualized and the stone was  then fragmented and all stone fragments appear to have been removed.  Then, the Amplatz sheath was pulled back into the lower pole calix and  after several maneuvers, we were able to visualize the stone.  Then, the  stone in the lower pole calix was also fragmented with the Lithoclast.  The lower pole calix was carefully inspected and there was no evidence  of remaining large fragment in the lower pole calix.  The nephroscope  was then removed.  A #22-French Councill tip catheter was then passed  over the working wire into the renal pelvis.  The balloon of the  catheter was then inflated with 5 mL of water and the working wire was  removed.  A nephroureteral catheter was then passed over the safety  wire and advanced in the distal ureter.  The wire that was placed in the  mid pole of the kidney was then removed.  The nephrostomy catheter was  then secured to the skin with number 2-0 silk.   The patient tolerated the procedure well and left the OR in satisfactory  condition to the post anesthesia care unit.      Lindaann Slough, M.D.  Electronically Signed     MN/MEDQ  D:  02/20/2007  T:  02/21/2007  Job:  [id number]

## 2010-11-22 NOTE — Op Note (Signed)
NAME:  Melinda Spears, Melinda Spears         ACCOUNT NO.:  [id number]   MEDICAL RECORD NO.:  [id number]          PATIENT TYPE:  AMB   LOCATION:  DAY                          FACILITY:  [facility]   PHYSICIAN:  Mark C. Vernie Ammons, M.D.  DATE OF BIRTH:  March 07, 1976   DATE OF PROCEDURE:  03/19/2007  DATE OF DISCHARGE:                               OPERATIVE REPORT   PREOPERATIVE DIAGNOSIS:  Retained right renal calculi.   POSTOPERATIVE DIAGNOSIS:  Retained right renal calculi.   PROCEDURE:  Nephroscopy with stone extraction.   SURGEON:  Mark C. Vernie Ammons, M.D.   ANESTHESIA:  General.   SPECIMENS:  Stone taken to office for stone analysis.   DRAINS:  None.   BLOOD LOSS:  Minimal.   COMPLICATIONS:  None.   INDICATIONS:  The patient is a 35 year old white Melinda Spears who had a  complete staghorn calculus.  Dr. Brunilda Payor perform percutaneous  nephrostolithotomy and cleared her of the majority of her stone.  A  second nephrostolithotomy was performed by him and cleared out nearly  99% of the stone.  There is now still just a few stone fragments  present.  She still has a nephrostomy tube indwelling and she returns  for flexible nephroscopy and extraction of the remaining stone  fragments.  The risks, complications and alternatives were discussed.  She understands  and elected to proceed.   DESCRIPTION OF OPERATION:  After informed consent, the patient brought  to the major OR, placed on table, administered general anesthesia and a  formal time-out was performed.  A Foley catheter was inserted in the  bladder and the nephrostomy tube was removed.  The flank was then  sterilely prepped and draped.  A 17-French flexible cystoscope was then  passed through the nephrostomy tract into the right kidney and directed  first toward the upper pole where known fragments were present.  Fragments were identified, grasped with nitinol basket and extracted one  at a time.  On her CT scan, she appeared to have multiple  punctate  stones.  They could not be visualized free floating and appeared to be  imbedded within the substance of the kidney consistent with Randall's  plaques.  All stone fragments were completely removed from the upper  pole.  I then systematically evaluated all calyces and in the lower pole  found several fragments.  These were extracted as well.  I then did a  relook into each calix and found all to be free of stone fragments that  were free.  There did appear to be some Randall plaques present in the  lower pole as well.  However, the kidney after complete inspection was  noted be free of any free stones.  I therefore removed the cystoscope.   Her nephrostomy tube tract site was closed with a single 4-0 Vicryl  suture placed subcuticularly and then dressed with a sterile gauze  dressing.  The Foley catheter was removed and the patient was taken to  recovery room in stable  satisfactory condition.  She tolerated procedure well there were no  intraoperative complications.  The stone will be analyzed by Dr. Brunilda Payor  and  she will undergo further metabolic evaluation to help her prevent  further stones in the future.      Mark C. Vernie Ammons, M.D.  Electronically Signed     MCO/MEDQ  D:  03/19/2007  T:  03/19/2007  Job:  [id number]

## 2010-11-22 NOTE — Discharge Summary (Signed)
NAME:  Melinda Spears, Melinda Spears         ACCOUNT NO.:  [id number]   MEDICAL RECORD NO.:  [id number]          PATIENT TYPE:  AMB   LOCATION:  XRAY                         FACILITY:  [facility]   PHYSICIAN:  Lindaann Slough, M.D.  DATE OF BIRTH:  [birth year]   DATE OF ADMISSION:  02/18/2007  DATE OF DISCHARGE:  02/18/2007                               DISCHARGE SUMMARY   Admission Date: 02/20/07  Discharge Date 02/25/07  Discharge Diagnoses:  1: Right stghorn calculi    2:  Urosepsis    3:  Anemia secondary to blood loss.   HISTORY OF PRESENT ILLNESS:  The patient is a 35 year old who had right  PCNL on 12/21/06.  She has remaining stone fragments in the upper and  lower pole s kidney.  Nephrostomy catheter was replaced on February 18, 2007 and another nephrostomy was done to access the lower pole of the  kidney.  Right PCNL of the remaing stone fragments was done on 02/20/07.   PHYSICAL EXAMINATION:  Blood pressure 120/70 respirations 24.  HEART:  Regular.  ABDOMEN:  Soft and nondistended, nontender.  She had two nephrostomies  in the left kidney.  Bowel sounds normal.   LABORATORY DATA:  Hemoglobin 11.3, hematocrit 33.6, WBC 6.5, BUN 3,  creatinine 0.7, sodium 135, potassium 4.3.  Urine culture showed  multiple species.  The patient had PCNL on February 20, 2007.  She spiked a fever to 102.3.  She was already on Cipro .  She was then started on gentamicin.  CT scan  on February 21, 2007, showed small stone fragments in the upper and lower  pole of the kidney and no evidence of  perinephric hematoma.  Hemoglobin  went down to 5.9.  She was transfused two units of packed red blood  cells.  Hemoglobin after transfusion  was up to 8.1 and hematocrit 23.9.  She continued to spike temperature up to 102.  On August 16, temperature  maximum was 102.7.  Hemoglobin went down to 7.2.  She was then  transfused two more units of packed cells.  Repeat CT scan showed no  evidence of perinephric hematoma.  On  February 25, 2007, temperature was  98.5, blood pressure 122/70 respirations 16,  hemoglobin 9.5, hematocrit  47.5.  The nephrostomy catheter was draining clear urine.  She did not  have any pain. She was eating well, had regular bowel movement. She was  then discharged home on Cipro 100 mg b.i.d., Percocet 5/325 1-2 q.4h  p.r.n. pain, ferrous sulfate one tablet t.i.d., Colace 100 mg b.i.d.   CONDITION ON DISCHARGE:  Improved.   DISCHARGE INSTRUCTIONS:  : No lifting, straining or driving.  1. Diet: Regular.  She will be followed in the office as outpatient.  She will need another  look to remove the remaining stone fragments with the nephroscope.  Hopefully, I will be able tomake her stone free.      Lindaann Slough, M.D.  Electronically Signed     MN/MEDQ  D:  02/25/2007  T:  02/25/2007  Job:  [id number]

## 2010-11-25 NOTE — H&P (Signed)
NAME:  Melinda Spears, Melinda Spears                     ACCOUNT NO.:  [id number]   MEDICAL RECORD NO.:  [id number]                   PATIENT TYPE:  MAT   LOCATION:  MATC                                 [facility]ILITY[facility]   PHYSICIAN:  Juan H. Lily Peer, M.D.             DATE OF BIRTH:  12-20-1975   DATE OF ADMISSION:  11/08/2002  DATE OF DISCHARGE:                                HISTORY & PHYSICAL   CHIEF COMPLAINT:  First trimester vaginal bleeding.   HISTORY OF PRESENT ILLNESS:  The patient is a 35 year old gravida 2, para 1,  who based on last normal menstrual period is currently 10-1/[redacted] weeks  gestation, and had been bleeding since Friday, and presented to [facility] earlier today, and was told she had an urinary tract  infection.  She was placed on Septra and released home.  She continued to  bleed, and presented to [facility] where by she was evaluated, had  an ultrasound which demonstrated an approximately 10 week intrauterine fetal  demise, and on examination tissue was extruded from the cervical os.  The  patient was hemodynamically stable.  Was care-linked to [facility]   PHYSICAL EXAMINATION:  GENERAL:  A well-developed, well-nourished female,  complaining of active bleeding and cramping.  HEENT:  Unremarkable.  NECK:  Supple, trachea midline, without bruits or thyromegaly.  LUNGS:  Clear to auscultation without rhonchi or wheezes.  HEART:  Regular rate and rhythm, no murmurs or gallops.  BREASTS:  Not done.  ABDOMEN:  Soft and nontender.  PELVIC:  Bartholin's, urethra, and Skene's within normal limits.  Vagina and  cervix:  Cervix with evidence of tissue protruding from the os and blood  present in the vaginal vault.  Bimanual examination:  Uterus approximately  10 weeks size.  RECTAL:  Not done.  VITAL SIGNS:  Temperature 98.8, pulse 74, and blood pressure 100/59.   PAST MEDICAL HISTORY:  The patient denies any medical problems.   PAST  SURGICAL HISTORY:  Previous cesarean section in [place] secondary to  breech.   ALLERGIES:  No known drug allergies.   HABITS:  She does not smoke or drink.    ASSESSMENT:  A 35 year old gravida 2, para 1, at 10-1/2 weeks estimated  gestational age with inevitable AB.  The patient with O positive blood type.  Will undergo an emergency D&E.  Risks, benefits, pros, and cons of the  procedure discussed with the patient and her husband.  All questions were  answered.  We will follow accordingly.   PLAN:  Emergency D&E this morning here at [facility].                                               [place] H. Lily Peer, M.D.    JHF/MEDQ  D:  11/08/2002  T:  11/08/2002  Job:  [id number]

## 2010-11-25 NOTE — Consult Note (Signed)
Melinda Spears, Melinda Spears                     ACCOUNT NO.:  [id number]   MEDICAL RECORD NO.:  [id number]                   PATIENT TYPE:  INP   LOCATION:  9157                                 [facility]ACILITY:  [facility]   PHYSICIAN:  Lindaann Slough, M.D.               DATE OF BIRTH:  [birth year]   DATE OF CONSULTATION:  12/04/2003  DATE OF DISCHARGE:  12/04/2003                                   CONSULTATION   REQUESTING PHYSICIAN:  Dr. Corky Sox.   REASON FOR CONSULTATION:  The patient is a 35 year old female, [redacted] weeks  pregnant, who was admitted with urinary tract infection.  About a week ago  she had right flank pain and was found to have a urinary tract infection.  She was treated with oral antibiotics but the infection did not clear up.  Apparently she was treated with Keflex and different other antibiotics and  she did not respond to those antibiotics.  She was then admitted for IV  antibiotics.  Renal ultrasound showed no significant dilation of the  collecting system and several echogenic foci of the right kidney suggesting  right renal calculi without obstruction.  She is currently asymptomatic.  She has no flank pain, no fever, and she has not been taking any pain  medication.   REVIEW OF SYSTEMS:  She has no cough, no shortness of breath, no chest pain,  no palpitations, no nausea, no vomiting, and she has frequency but no  dysuria or hematuria.   PHYSICAL EXAMINATION:  She has no CVA tenderness, her kidneys are not  palpable and she has no tenderness in the suprapubic area.   Her hemoglobin is 12.5, hematocrit 37.2, and wbc 7.4.  Urine culture is  positive for Proteus mirabilis and that is sensitive to all antibiotics  except nitrofurantoin and trimethoprim/sulfa.   IMPRESSION:  Persistent urinary tract infection and right renal calculi.   SUGGESTION:  Continue antibiotics.  The patient could be discharged home on  oral antibiotics.  There is no indication for treatment of  the renal calculi  at this time and I will follow her as an outpatient after delivery.                                               Lindaann Slough, M.D.    MN/MEDQ  D:  12/04/2003  T:  12/05/2003  Job:  [id number]   cc:   Conni Elliot, M.D.  [address]  [place]  [place] [address]  Fax: [phone]

## 2010-11-25 NOTE — Op Note (Signed)
   NAME:  Melinda Spears, Melinda Spears                     ACCOUNT NO.:  [id number]   MEDICAL RECORD NO.:  [id number]                   PATIENT TYPE:  MAT   LOCATION:  MATC                                 [facility]ILITY[facility]   PHYSICIAN:  Juan H. Lily Peer, M.D.             DATE OF BIRTH:  January 26, 1976   DATE OF PROCEDURE:  11/08/2002  DATE OF DISCHARGE:                                 OPERATIVE REPORT   PREOPERATIVE DIAGNOSIS:  First trimester inevitable abortion.   POSTOPERATIVE DIAGNOSIS:  First trimester inevitable abortion.   PROCEDURE:  Dilatation and evacuation.   ANESTHESIA:  1. MAC.  2. Paracervical block with 2% Xylocaine with 1:100,000 epinephrine.   SURGEON:  Juan H. Lily Peer, M.D.   INDICATION FOR PROCEDURE:  A 35 year old gravida 2, para 1, at 10-1/2 weeks'  gestation, with inevitable AB.   DESCRIPTION OF PROCEDURE:  After the patient was adequately counseled she  was taken to the operating room, where after MAC anesthesia was administered  the vagina and perineum were prepped and draped in the usual sterile  fashion.  A red rubber Roxan Hockey was inserted to evacuate the bladder of its  contents for approximately 75 mL of clear urine.  Examination under  anesthesia demonstrated a 10 week-size uterus.  After the speculum was in  place, tissue was evident extruding from the external os, consistent with an  inevitable AB.  Then 2% Xylocaine with 1:100,000 was infiltrated into the  cervical stroma at the 2, 4, 8, and 10 o'clock position.  The cervix  required no dilatation, and a 10 mm suction curette was inserted into the  intrauterine cavity to remove the products of conception.  This was  interchanged with a serrated curette to ascertain complete evacuation of the  products of conception from the intrauterine cavity.  The patient received  10 units of Pitocin in her IV, received 1 g of Cefotan for prophylaxis, and  will receive 30 mg of Toradol en route to the recovery room.  Her  blood type  is O positive.  She tolerated the procedure well with stable vital signs.                                               Juan H. Lily Peer, M.D.    JHF/MEDQ  D:  11/08/2002  T:  11/08/2002  Job:  [id number]

## 2010-11-25 NOTE — Discharge Summary (Signed)
Melinda Spears, Melinda Spears         ACCOUNT NO.:  [id number]   MEDICAL RECORD NO.:  [id number]          PATIENT TYPE:  INP   LOCATION:  1413                         [facility]ACILITY:  [facility]   PHYSICIAN:  Lindaann Slough, M.D.  DATE OF BIRTH:  11/30/1975   DATE OF ADMISSION:  12/21/2006  DATE OF DISCHARGE:  12/23/2006                               DISCHARGE SUMMARY   DISCHARGE DIAGNOSIS:  Right staghorn calculus.   PROCEDURE:  Right percutaneous nephrostomy and right percutaneous  nephrolithotomy on December 21, 2006.   HISTORY OF PRESENT ILLNESS:  The patient is a 35 year old female who was  admitted on December 21, 2006, with a right staghorn calculus.  She had a  right percutaneous nephrostomy done the morning of the procedure and had  a right percutaneous nephrolithotomy done on December 21, 2006.   PAST MEDICAL HISTORY:  Positive for kidney stones.   PHYSICAL EXAMINATION:  VITAL SIGNS:  Blood pressure 114/65, pulse 85,  respirations 24.  HEENT:  Head is normal.  LUNGS:  Clear.  HEART:  Regular rhythm.  ABDOMEN:  Soft, nondistended.  Kidneys are not palpable.  Bladder is not  distended.  She has no inguinal hernia.   LABORATORY DATA AND X-RAY FINDINGS:  Hemoglobin on admission was 14.3,  hematocrit 42, WBC 4.9.  Sodium 140, potassium 5.2, BUN 12, creatinine  0.58.   HOSPITAL COURSE:  She had a right percutaneous nephrolithotomy done on  December 21, 2006.  She is known to have two remaining stone fragments, one  in the upper pole and one in the lower pole of the kidney.  Postop  course was uneventful.  She remained afebrile.  She was voiding well.  Urine was clear.  She was discharged home on December 23, 2006, to be  followed in the office.   DISCHARGE DIET:  Regular.   DISCHARGE MEDICATIONS:  1. Cipro 500 mg twice a day.  2. Percocet 5/325 one to two tablets q.4 h. p.r.n. pain.   SPECIAL INSTRUCTIONS:  She is instructed in how to use the nephrostomy  catheter.   FOLLOW UP:  She will be  followed in the office for management of  remaining stone fragments.   CONDITION ON DISCHARGE:  Improved.      Lindaann Slough, M.D.  Electronically Signed     MN/MEDQ  D:  02/01/2007  T:  02/02/2007  Job:  [id number]

## 2010-11-25 NOTE — Discharge Summary (Signed)
NAMEJODILYN, Melinda Spears                     ACCOUNT NO.:  [id number]   MEDICAL RECORD NO.:  [id number]                   PATIENT TYPE:  INP   LOCATION:  9157                                 [facility]ACILITY[facility]   PHYSICIAN:  Conni Elliot, M.D.             DATE OF BIRTH:  1975/11/11   DATE OF ADMISSION:  12/02/2003  DATE OF DISCHARGE:  12/04/2003                                 DISCHARGE SUMMARY   HISTORY OF PRESENT ILLNESS:  The patient is a 35 year old gravida 3, para 1-  0-1-1, at 35-4/[redacted] weeks gestation by dates and ultrasound.  The patient is  being admitted due to pyelonephritis and history of recurrent UTIs.   PHYSICAL EXAMINATION:  VITAL SIGNS:  Temperature 98.7, pulse 69,  respirations 69, blood pressure 92/51.   HOSPITAL COURSE:  The patient was admitted and placed on ampicillin 2 g IV  q.6h., and patient responded to the antibiotic therapy.  She remained  afebrile.  The patient was discharged on oral antibiotics, to be followed up  in high-risk clinic.   The patient was found to have a kidney stone, and urology was consulted, and  he agreed with the plan of discharge on oral antibiotics since there was no  evidence of obstruction.  The patient was discharged to be seen in high-risk  clinic on Dec 07, 2003.                                               Conni Elliot, M.D.    ASG/MEDQ  D:  02/02/2004  T:  02/02/2004  Job:  [id number]

## 2010-12-23 ENCOUNTER — Other Ambulatory Visit (HOSPITAL_COMMUNITY): Payer: Self-pay | Admitting: Family Medicine

## 2010-12-23 DIAGNOSIS — M25562 Pain in left knee: Secondary | ICD-10-CM

## 2010-12-30 ENCOUNTER — Ambulatory Visit (HOSPITAL_COMMUNITY)
Admission: RE | Admit: 2010-12-30 | Discharge: 2010-12-30 | Disposition: A | Discharge status: Home / Self Care | Payer: Self-pay | Source: Ambulatory Visit | Attending: Family Medicine | Admitting: Family Medicine

## 2010-12-30 DIAGNOSIS — M25569 Pain in unspecified knee: Secondary | ICD-10-CM | POA: Insufficient documentation

## 2010-12-30 DIAGNOSIS — M25469 Effusion, unspecified knee: Secondary | ICD-10-CM | POA: Insufficient documentation

## 2010-12-30 DIAGNOSIS — M25562 Pain in left knee: Secondary | ICD-10-CM

## 2011-04-21 LAB — PREGNANCY, URINE: Preg Test, Ur: NEGATIVE

## 2011-04-21 LAB — CROSSMATCH
ABO/RH(D): O POS
Antibody Screen: NEGATIVE

## 2011-04-21 LAB — DIFFERENTIAL
Basophils Absolute: 0
Basophils Relative: 1
Eosinophils Absolute: 0.2
Eosinophils Relative: 4
Lymphocytes Relative: 23
Lymphs Abs: 0.9
Monocytes Absolute: 0.3
Monocytes Relative: 8
Neutro Abs: 2.7
Neutrophils Relative %: 64

## 2011-04-21 LAB — BASIC METABOLIC PANEL
BUN: 7
BUN: <1 — ABNORMAL LOW
CO2: 27
CO2: 28
Calcium: 9.6
Calcium: 8.4
Chloride: 102
Chloride: 107
Creatinine, Ser: 0.6
Creatinine, Ser: 0.53
GFR calc Af Amer: >60
GFR calc Af Amer: >60
GFR calc non Af Amer: >60
GFR calc non Af Amer: >60
Glucose, Bld: 83
Glucose, Bld: 129 — ABNORMAL HIGH
Potassium: 4.4
Potassium: 3.8
Sodium: 136
Sodium: 139

## 2011-04-21 LAB — CBC
HCT: 37.7
HCT: 21.2 — ABNORMAL LOW
Hemoglobin: 12.6
Hemoglobin: 7.2 — CL
MCHC: 33.4
MCHC: 33.8
MCV: 87.4
MCV: 84.1
Platelets: 254
Platelets: 206
RBC: 4.31
RBC: 2.52 — ABNORMAL LOW
RDW: 18.6 — ABNORMAL HIGH
RDW: 15.8 — ABNORMAL HIGH
WBC: 5.8
WBC: 4.1

## 2011-04-21 LAB — HEMOGLOBIN AND HEMATOCRIT, BLOOD
HCT: 27.9 — ABNORMAL LOW
Hemoglobin: 9.5 — ABNORMAL LOW

## 2011-04-21 LAB — GENTAMICIN LEVEL, TROUGH: Gentamicin Trough: <0.5 — ABNORMAL LOW

## 2011-04-24 LAB — URINE CULTURE: Colony Count: >=100000

## 2011-04-24 LAB — CROSSMATCH
ABO/RH(D): O POS
Antibody Screen: NEGATIVE

## 2011-04-24 LAB — CBC
Hemoglobin: 11.3 — ABNORMAL LOW
MCHC: 34.4
MCV: 83.1
MCV: 86.4
Platelets: 182
RBC: 3.9
RDW: 14.8 — ABNORMAL HIGH
WBC: 6.9
WBC: 8.6

## 2011-04-24 LAB — BASIC METABOLIC PANEL
BUN: 4 — ABNORMAL LOW
CO2: 24
Calcium: 6.9 — ABNORMAL LOW
Chloride: 101
Creatinine, Ser: 0.71
Creatinine, Ser: 0.78
GFR calc Af Amer: >60
Glucose, Bld: 143 — ABNORMAL HIGH
Sodium: 135

## 2011-04-24 LAB — URINE MICROSCOPIC-ADD ON

## 2011-04-24 LAB — POCT I-STAT 4, (NA,K, GLUC, HGB,HCT)
HCT: 26 — ABNORMAL LOW
Operator id: 101711
Potassium: 4.1
Sodium: 138

## 2011-04-24 LAB — URINALYSIS, ROUTINE W REFLEX MICROSCOPIC
Bilirubin Urine: NEGATIVE
Glucose, UA: NEGATIVE
Ketones, ur: NEGATIVE
Protein, ur: 30 — AB
Urobilinogen, UA: 0.2

## 2011-04-24 LAB — ABO/RH: ABO/RH(D): O POS

## 2011-04-24 LAB — PROTIME-INR
INR: 1.2
Prothrombin Time: 15

## 2011-04-24 LAB — GENTAMICIN LEVEL, RANDOM: Gentamicin Rm: 1.5 ug/mL

## 2011-04-24 LAB — CREATININE, SERUM: GFR calc Af Amer: >60

## 2011-04-27 LAB — URINE MICROSCOPIC-ADD ON

## 2011-04-27 LAB — COMPREHENSIVE METABOLIC PANEL WITH GFR
ALT: 14
AST: 14
Albumin: 4
Alkaline Phosphatase: 55
BUN: 12
CO2: 28
Calcium: 9.5
Chloride: 106
Creatinine, Ser: 0.58
GFR calc non Af Amer: >60
Glucose, Bld: 96
Potassium: 5.2 — ABNORMAL HIGH
Sodium: 140
Total Bilirubin: 0.7
Total Protein: 7.5

## 2011-04-27 LAB — URINE CULTURE
Colony Count: >=100000
Special Requests: NEGATIVE

## 2011-04-27 LAB — BASIC METABOLIC PANEL
BUN: 5 — ABNORMAL LOW
CO2: 26
Calcium: 8.2 — ABNORMAL LOW
Chloride: 100
Chloride: 107
GFR calc Af Amer: >60
Potassium: 4
Sodium: 132 — ABNORMAL LOW
Sodium: 137

## 2011-04-27 LAB — URINALYSIS, ROUTINE W REFLEX MICROSCOPIC
Bilirubin Urine: NEGATIVE
Glucose, UA: NEGATIVE
Ketones, ur: NEGATIVE
Nitrite: NEGATIVE
Protein, ur: NEGATIVE
Specific Gravity, Urine: 1.013
Urobilinogen, UA: 0.2
pH: 7.5

## 2011-04-27 LAB — PROTIME-INR
INR: 1.1
Prothrombin Time: 14.1

## 2011-04-27 LAB — STONE ANALYSIS: Stone Weight KSTONE: 140.6 mg

## 2011-04-27 LAB — PREGNANCY, URINE: Preg Test, Ur: NEGATIVE

## 2011-04-27 LAB — CBC
HCT: 33.9 — ABNORMAL LOW
Hemoglobin: 11.6 — ABNORMAL LOW
Hemoglobin: 12
Hemoglobin: 14.3
MCHC: 33.4
MCHC: 34
MCV: 93.5
MCV: 93.9
Platelets: 173
RBC: 3.82 — ABNORMAL LOW
RBC: 4.47
WBC: 4.9
WBC: 9

## 2011-04-27 LAB — APTT: aPTT: 30

## 2011-07-11 HISTORY — DX: Maternal care for unspecified type scar from previous cesarean delivery: O34.219

## 2011-07-11 NOTE — L&D Delivery Note (Signed)
Delivery Note Pt progressed rapidly to complete, pushed twice and at 12:29 PM a viable female was delivered via  (Presentation: Direct OA ). No nuchal. Vigorous infant to mom. APGAR: 8/9; weight pending .   Placenta status: SVD, intact by Veatrice Kells, .  Cord: 3VC with the following complications: .    Anesthesia:  None Episiotomy: None Lacerations: None Suture Repair: None Est. Blood Loss (mL): 200cc  Mom to postpartum.  Baby to NICU secondary to prematurity, on RA. Placenta to pathology  Melinda Spears E. 02/07/2012, 12:45 PM

## 2011-07-16 ENCOUNTER — Inpatient Hospital Stay (HOSPITAL_COMMUNITY): Payer: Self-pay

## 2011-07-16 ENCOUNTER — Encounter (HOSPITAL_COMMUNITY): Payer: Self-pay

## 2011-07-16 ENCOUNTER — Inpatient Hospital Stay (HOSPITAL_COMMUNITY)
Admission: AD | Admit: 2011-07-16 | Discharge: 2011-07-16 | Disposition: A | Discharge status: Home / Self Care | Payer: Self-pay | Source: Ambulatory Visit | Attending: Obstetrics & Gynecology | Admitting: Obstetrics & Gynecology

## 2011-07-16 DIAGNOSIS — Z09 Encounter for follow-up examination after completed treatment for conditions other than malignant neoplasm: Secondary | ICD-10-CM

## 2011-07-16 DIAGNOSIS — R109 Unspecified abdominal pain: Secondary | ICD-10-CM | POA: Insufficient documentation

## 2011-07-16 DIAGNOSIS — O4690 Antepartum hemorrhage, unspecified, unspecified trimester: Secondary | ICD-10-CM

## 2011-07-16 DIAGNOSIS — O209 Hemorrhage in early pregnancy, unspecified: Secondary | ICD-10-CM | POA: Insufficient documentation

## 2011-07-16 DIAGNOSIS — I4949 Other premature depolarization: Secondary | ICD-10-CM

## 2011-07-16 HISTORY — DX: Calculus of kidney: N20.0

## 2011-07-16 HISTORY — DX: Encounter for other specified aftercare: Z51.89

## 2011-07-16 LAB — URINALYSIS, ROUTINE W REFLEX MICROSCOPIC
Bilirubin Urine: NEGATIVE
Glucose, UA: NEGATIVE mg/dL
Ketones, ur: NEGATIVE mg/dL
Nitrite: NEGATIVE
pH: 6.5 (ref 5.0–8.0)

## 2011-07-16 LAB — CBC
MCH: 32.5 pg (ref 26.0–34.0)
MCHC: 34.5 g/dL (ref 30.0–36.0)
Platelets: 261 10*3/uL (ref 150–400)
RDW: 12.6 % (ref 11.5–15.5)

## 2011-07-16 LAB — WET PREP, GENITAL
Clue Cells Wet Prep HPF POC: NONE SEEN
Trich, Wet Prep: NONE SEEN
Yeast Wet Prep HPF POC: NONE SEEN

## 2011-07-16 LAB — HCG, QUANTITATIVE, PREGNANCY: hCG, Beta Chain, Quant, S: 2829 m[IU]/mL — ABNORMAL HIGH (ref <5)

## 2011-07-16 LAB — URINE MICROSCOPIC-ADD ON

## 2011-07-16 NOTE — Progress Notes (Signed)
2 positive home pregnancy test LMP 05/26/11 onset of lower abdominal pain and back pain for 3 days, spotting dark black color.

## 2011-07-16 NOTE — ED Provider Notes (Signed)
History     Chief Complaint  Patient presents with  . Abdominal Pain   HPI Melinda Spears 36 y.o. 7w 2d gestation by LMP of 05-26-11.  Has had vaginal bleeding and abdominal cramping for several days.  Has not yet started prenatal care.  Spanish speaking - interpreter present for interview and exam.  OB History    Grav Para Term Preterm Abortions TAB SAB Ect Mult Living   4 2 2  0 1 0 1 0 0 2      Past Medical History  Diagnosis Date  . Blood transfusion 2009    s/p kidney surgery  . Kidney stone 2009    Past Surgical History  Procedure Date  . Cesarean section   . Kidney surgery 2009    No family history on file.  History  Substance Use Topics  . Smoking status: Never Smoker   . Smokeless tobacco: Not on file  . Alcohol Use: No    Allergies: No Known Allergies  Prescriptions prior to admission  Medication Sig Dispense Refill  . acetaminophen (TYLENOL) 325 MG tablet Take 650 mg by mouth every 6 (six) hours as needed. For pain       . CALCIUM PO Take 1 tablet by mouth daily.        . Multiple Vitamin (MULITIVITAMIN WITH MINERALS) TABS Take 1 tablet by mouth daily.          Review of Systems  Gastrointestinal: Positive for abdominal pain.  Genitourinary:       Vaginal bleeding   Physical Exam   Blood pressure 120/60, pulse 74, temperature 99.6 F (37.6 C), temperature source Oral, resp. rate 16, height 5' 4.5" (1.638 m), weight 158 lb 6.4 oz (71.85 kg), last menstrual period 05/26/2011.  Physical Exam  Nursing note and vitals reviewed. Constitutional: She is oriented to person, place, and time. She appears well-developed and well-nourished.  HENT:  Head: Normocephalic.  Eyes: EOM are normal.  Neck: Neck supple.  GI: Soft. There is no tenderness.  Genitourinary:       Speculum exam: Vagina - Small amount of bloody discharge, no odor Cervix - No contact bleeding, 1 cm flat flap of tissue seen in cervical os - gray on one side and dark red on the  other side Bimanual exam: Cervix closed and thick Uterus non tender, 5 week size Adnexa non tender, no masses bilaterally GC/Chlam, wet prep done Chaperone present for exam.  Musculoskeletal: Normal range of motion.  Neurological: She is alert and oriented to person, place, and time.  Skin: Skin is warm and dry.  Psychiatric: She has a normal mood and affect.    MAU Course  Procedures Results for orders placed during the hospital encounter of 07/16/11 (from the past 24 hour(s))  URINALYSIS, ROUTINE W REFLEX MICROSCOPIC     Status: Abnormal   Collection Time   07/16/11  2:40 PM      Component Value Range   Color, Urine YELLOW  YELLOW    APPearance CLEAR  CLEAR    Specific Gravity, Urine 1.015  1.005 - 1.030    pH 6.5  5.0 - 8.0    Glucose, UA NEGATIVE  NEGATIVE (mg/dL)   Hgb urine dipstick TRACE (*) NEGATIVE    Bilirubin Urine NEGATIVE  NEGATIVE    Ketones, ur NEGATIVE  NEGATIVE (mg/dL)   Protein, ur NEGATIVE  NEGATIVE (mg/dL)   Urobilinogen, UA 1.0  0.0 - 1.0 (mg/dL)   Nitrite NEGATIVE  NEGATIVE  Leukocytes, UA NEGATIVE  NEGATIVE   URINE MICROSCOPIC-ADD ON     Status: Abnormal   Collection Time   07/16/11  2:40 PM      Component Value Range   Squamous Epithelial / LPF FEW (*) RARE    WBC, UA 0-2  <3 (WBC/hpf)   RBC / HPF 0-2  <3 (RBC/hpf)   Bacteria, UA MANY (*) RARE   POCT PREGNANCY, URINE     Status: Normal   Collection Time   07/16/11  2:52 PM      Component Value Range   Preg Test, Ur POSITIVE    WET PREP, GENITAL     Status: Abnormal   Collection Time   07/16/11  3:15 PM      Component Value Range   Yeast, Wet Prep NONE SEEN  NONE SEEN    Trich, Wet Prep NONE SEEN  NONE SEEN    Clue Cells, Wet Prep NONE SEEN  NONE SEEN    WBC, Wet Prep HPF POC MODERATE (*) NONE SEEN   CBC     Status: Abnormal   Collection Time   07/16/11  3:17 PM      Component Value Range   WBC 8.9  4.0 - 10.5 (K/uL)   RBC 3.78 (*) 3.87 - 5.11 (MIL/uL)   Hemoglobin 12.3  12.0 - 15.0 (g/dL)    HCT 16.1 (*) 09.6 - 46.0 (%)   MCV 94.4  78.0 - 100.0 (fL)   MCH 32.5  26.0 - 34.0 (pg)   MCHC 34.5  30.0 - 36.0 (g/dL)   RDW 04.5  40.9 - 81.1 (%)   Platelets 261  150 - 400 (K/uL)  HCG, QUANTITATIVE, PREGNANCY     Status: Abnormal   Collection Time   07/16/11  3:18 PM      Component Value Range   hCG, Beta Chain, Quant, S 2829 (*) <5 (mIU/mL)    MDM OBSTETRIC <14 WK [place] AND TRANSVAGINAL OB US  Technique: Both transabdominal and transvaginal ultrasound  examinations were performed for complete evaluation of the  gestation as well as the maternal uterus, adnexal regions, and  pelvic cul-de-sac. Transvaginal technique was performed to assess  early pregnancy.  Comparison: None.  Intrauterine gestational sac: Visualized/normal in shape.  Yolk sac: Not visualized.  Embryo: Not visualized.  Cardiac Activity: Not visualized  MSD: 4.0 mm 4 w 6 d [place] EDC: 03/18/2012  Maternal uterus/adnexae:  Small subchorionic hemorrhage. 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 Probable corpus luteum in the left  ovary. Normal appearing right ovary. Very tiny amount of free  peritoneal fluid, within normal limits of physiological fluid.  IMPRESSION:  1. Intrauterine gestational sac with an estimated gestational age  of [redacted] weeks and 6 days. There is no fetal pole visible at this  time. Differential considerations include normal early  intrauterine pregnancy. A follow-up obstetrical ultrasound is  recommended in 2 weeks.  2. Small subchorionic hemorrhage.   Consult with Dr. Debroah Loop re: plan of care  Assessment and Plan  Bleeding in early pregnancy  Plan Repeat quant in 48 hours - return on Wed AM   BURLESON,TERRI 07/16/2011, 3:22 PM   Nolene Bernheim, NP 07/16/11 1627

## 2011-07-16 NOTE — Progress Notes (Signed)
Lower abdominal cramping that started 3 days ago. Dark brown spotting x3 days that she sees when she wipes.

## 2011-07-17 LAB — GC/CHLAMYDIA PROBE AMP, GENITAL: Chlamydia, DNA Probe: NEGATIVE

## 2011-07-19 ENCOUNTER — Encounter (HOSPITAL_COMMUNITY): Payer: Self-pay | Admitting: *Deleted

## 2011-07-19 ENCOUNTER — Inpatient Hospital Stay (HOSPITAL_COMMUNITY): Payer: Self-pay

## 2011-07-19 ENCOUNTER — Inpatient Hospital Stay (HOSPITAL_COMMUNITY)
Admission: AD | Admit: 2011-07-19 | Discharge: 2011-07-19 | Disposition: A | Discharge status: Home / Self Care | Payer: Self-pay | Source: Ambulatory Visit | Attending: Obstetrics & Gynecology | Admitting: Obstetrics & Gynecology

## 2011-07-19 ENCOUNTER — Other Ambulatory Visit: Payer: Self-pay

## 2011-07-19 DIAGNOSIS — O99891 Other specified diseases and conditions complicating pregnancy: Secondary | ICD-10-CM | POA: Insufficient documentation

## 2011-07-19 DIAGNOSIS — Z09 Encounter for follow-up examination after completed treatment for conditions other than malignant neoplasm: Secondary | ICD-10-CM

## 2011-07-19 DIAGNOSIS — I493 Ventricular premature depolarization: Secondary | ICD-10-CM

## 2011-07-19 DIAGNOSIS — I498 Other specified cardiac arrhythmias: Secondary | ICD-10-CM | POA: Insufficient documentation

## 2011-07-19 LAB — CBC
HCT: 35.8 % — ABNORMAL LOW (ref 36.0–46.0)
Hemoglobin: 12.4 g/dL (ref 12.0–15.0)
MCH: 32.6 pg (ref 26.0–34.0)
MCHC: 34.6 g/dL (ref 30.0–36.0)
RBC: 3.8 MIL/uL — ABNORMAL LOW (ref 3.87–5.11)

## 2011-07-19 LAB — COMPREHENSIVE METABOLIC PANEL
ALT: 7 U/L (ref 0–35)
AST: 10 U/L (ref 0–37)
CO2: 26 mEq/L (ref 19–32)
Calcium: 9.5 mg/dL (ref 8.4–10.5)
Chloride: 101 mEq/L (ref 96–112)
Creatinine, Ser: 0.59 mg/dL (ref 0.50–1.10)
GFR calc Af Amer: >90 mL/min (ref >90)
GFR calc non Af Amer: >90 mL/min (ref >90)
Glucose, Bld: 90 mg/dL (ref 70–99)
Sodium: 134 mEq/L — ABNORMAL LOW (ref 135–145)
Total Bilirubin: 0.4 mg/dL (ref 0.3–1.2)

## 2011-07-19 NOTE — ED Provider Notes (Signed)
Called by triage nurse to come to patient bedside re: bradycardia. Patient is here today for follow up Bhcg. She was evaluated 07/16/11 and had an ultrasound that showed a 4.6 wk. IUGS, no YS. Patient denies abdominal pain, shortness of breath or any other problems. No cardiac history. Translator, Eda Royal at bedside.   The patient appears comfortable and in no acute distress. Her abdomen is soft and non tender with palpation. She has normal respiratory effort and no respiratory distress. Heart = bradycardia with PVC's.  Because of the Bradycardia and irregularity, the Rapid Response was initiated by the Charge Nurse, Gwenyth Bender. Dr. Malen Gauze from Anesthesia and the AICU RN, Idalia Needle, responded. Dr. Gerilyn Pilgrim also in to evaluate the patient.   VS: Initially showed a heart rate of 38, blood pressure 101/40 and respirations 16. O2 Sat 99% on Room air.  Results for orders placed during the hospital encounter of 07/19/11 (from the past 24 hour(s))  HCG, QUANTITATIVE, PREGNANCY     Status: Abnormal   Collection Time   07/19/11  9:20 AM      Component Value Range   hCG, Beta Chain, Quant, S 6779 (*) <5 (mIU/mL)  MAGNESIUM     Status: Normal   Collection Time   07/19/11  9:20 AM      Component Value Range   Magnesium 2.0  1.5 - 2.5 (mg/dL)  COMPREHENSIVE METABOLIC PANEL     Status: Abnormal   Collection Time   07/19/11  9:20 AM      Component Value Range   Sodium 134 (*) 135 - 145 (mEq/L)   Potassium 4.1  3.5 - 5.1 (mEq/L)   Chloride 101  96 - 112 (mEq/L)   CO2 26  19 - 32 (mEq/L)   Glucose, Bld 90  70 - 99 (mg/dL)   BUN 12  6 - 23 (mg/dL)   Creatinine, Ser 1.61  0.50 - 1.10 (mg/dL)   Calcium 9.5  8.4 - 09.6 (mg/dL)   Total Protein 6.8  6.0 - 8.3 (g/dL)   Albumin 4.0  3.5 - 5.2 (g/dL)   AST 10  0 - 37 (U/L)   ALT 7  0 - 35 (U/L)   Alkaline Phosphatase 53  39 - 117 (U/L)   Total Bilirubin 0.4  0.3 - 1.2 (mg/dL)   GFR calc non Af Amer >90  >90 (mL/min)   GFR calc Af Amer >90  >90 (mL/min)  CBC      Status: Abnormal   Collection Time   07/19/11  9:24 AM      Component Value Range   WBC 6.1  4.0 - 10.5 (K/uL)   RBC 3.80 (*) 3.87 - 5.11 (MIL/uL)   Hemoglobin 12.4  12.0 - 15.0 (g/dL)   HCT 04.5 (*) 40.9 - 46.0 (%)   MCV 94.2  78.0 - 100.0 (fL)   MCH 32.6  26.0 - 34.0 (pg)   MCHC 34.6  30.0 - 36.0 (g/dL)   RDW 81.1  91.4 - 78.2 (%)   Platelets 252  150 - 400 (K/uL)    Ultrasound today shows normal progression in the pregnancy and a YS is visualized.   EKG: Normal rate with PVC's noted  Assessment: First trimester pregnancy normal progression   Bradycardia with PVC's  Plan:  Start prenatal care   Discussed with Dr. Christella Hartigan and patient is to follow up with Cardiology on an out patient basis   Return here as needed.    [place], [place] 07/19/11 1119

## 2011-07-19 NOTE — Progress Notes (Signed)
[facility] Department of Clinical Social Work Documentation of Interpretation   I assisted Writer Dr. Malen Gauze ___________________ with interpretation of __plan of care____________________ for this patient.

## 2011-07-19 NOTE — Progress Notes (Signed)
Patient returned to MAU for a follow up BHCG. Patient states she is having a little spotting and a little cramping. Eda Royal in for triage. Patient has a pulse rate of 35-40, confirmed by radial and ascultation. Mayer Camel, NP called in to triage to evaluate patient. Patient moved to room 9 and stat EKG called.

## 2011-08-09 ENCOUNTER — Ambulatory Visit (INDEPENDENT_AMBULATORY_CARE_PROVIDER_SITE_OTHER): Payer: Self-pay | Admitting: Cardiology

## 2011-08-09 ENCOUNTER — Encounter: Payer: Self-pay | Admitting: Cardiology

## 2011-08-09 VITALS — BP 95/40 | HR 74 | Wt 161.0 lb

## 2011-08-09 DIAGNOSIS — I493 Ventricular premature depolarization: Secondary | ICD-10-CM

## 2011-08-09 DIAGNOSIS — R001 Bradycardia, unspecified: Secondary | ICD-10-CM | POA: Insufficient documentation

## 2011-08-09 DIAGNOSIS — I498 Other specified cardiac arrhythmias: Secondary | ICD-10-CM

## 2011-08-09 DIAGNOSIS — I4949 Other premature depolarization: Secondary | ICD-10-CM

## 2011-08-09 NOTE — Patient Instructions (Addendum)
Your physician has requested that you have an echocardiogram. Echocardiography is a painless test that uses sound waves to create images of your heart. It provides your doctor with information about the size and shape of your heart and how well your heart's chambers and valves are working. This procedure takes approximately one hour. There are no restrictions for this procedure.  Your physician has recommended that you wear a holter monitor for 48 hours. Holter monitors are medical devices that record the heart's electrical activity. Doctors most often use these monitors to diagnose arrhythmias. Arrhythmias are problems with the speed or rhythm of the heartbeat. The monitor is a small, portable device. You can wear one while you do your normal daily activities. This is usually used to diagnose what is causing palpitations/syncope (passing out).  Follow up will be based on the results

## 2011-08-09 NOTE — Assessment & Plan Note (Signed)
She is having no symptoms related to these. I will check an echocardiogram to make sure she has a structurally normal heart. I will also request a cemented and TSH if I cannot find one in our system.

## 2011-08-09 NOTE — Progress Notes (Signed)
Due to language barrier, an interpreter was present during the history-taking and subsequent discussion (and for part of the physical exam) with this patient. Interpreter Marilynne Dupuis Namihira 12.00 DR Antoine Poche 08/09/2011

## 2011-08-09 NOTE — Progress Notes (Signed)
HPI The patient presents for evaluation of premature ventricular contractions. She was recently seen at [facility]. She thought she was pregnant and indeed she was. She is [redacted] weeks pregnant. She was noted to have PVCs. A report of an EKG as listed below. She was said to be bradycardic as well though I don't see any rhythm strips. She actually does not feel any palpitations. She does not have lightheadedness, presyncope or syncope. She has no chest pressure, neck or arm discomfort. She has no shortness of breath, PND or orthopnea. She has had no weight gain or edema. She has had some slight fatigue that she thought was probably her pregnancy. She's never had any further cardiovascular problems or testing. She's had two other pregnancies and she had no cardiovascular problems with these.  No Known Allergies  No current outpatient prescriptions on file.    Past Medical History  Diagnosis Date  . Blood transfusion 2009    s/p kidney surgery  . Kidney stone 2009  . Acute meniscal tear, lateral     right leg  . Pain, dental   . Kidney calculus     right staghorn  . Knee effusion, right     joint    Past Surgical History  Procedure Date  . Cesarean section   . Kidney surgery 2009  . Appendectomy   . Nephrolithotomy 12/21/2006    percutaneous nephrolithotomy of right staghorn calculus- carbonate apatite  . Kidney stone surgery 02/20/2007    repeat pcnl for remaining right kidney stone fragments  . Kidney stone surgery 03/19/2007    for futher stone fragment removal    Family History  Problem Relation Age of Onset  . Diabetes Father   . Coronary artery disease Father 77    History   Social History  . Marital Status: Married    Spouse Name: N/A    Number of Children: 2  . Years of Education: N/A   Occupational History  . Cook at [employer]    Social History Main Topics  . Smoking status: Never Smoker   . Smokeless tobacco: Never Used  . Alcohol Use: No  . Drug Use:  No  . Sexually Active: Yes    Birth Control/ Protection: None   Other Topics Concern  . Not on file   Social History Narrative   Diet Coke one daily    ROS:  Right flank pain.  Otherwise as stated in the HPI and negative for all other systems.  PHYSICAL EXAM BP 95/40  Pulse 74  Wt 161 lb (73.029 kg)  LMP 05/26/2011 GENERAL:  Well appearing HEENT:  Pupils equal round and reactive, fundi not visualized, oral mucosa unremarkable NECK:  No jugular venous distention, waveform within normal limits, carotid upstroke brisk and symmetric, no bruits, no thyromegaly LYMPHATICS:  No cervical, inguinal adenopathy LUNGS:  Clear to auscultation bilaterally BACK:  No CVA tenderness, well healed surgical scar CHEST:  Unremarkable HEART:  PMI not displaced or sustained,S1 and S2 within normal limits, no S3, no S4, no clicks, no rubs, no murmurs ABD:  Flat, positive bowel sounds normal in frequency in pitch, no bruits, no rebound, no guarding, no midline pulsatile mass, no hepatomegaly, no splenomegaly EXT:  2 plus pulses throughout, no edema, no cyanosis no clubbing SKIN:  No rashes no nodules NEURO:  Cranial nerves II through XII grossly intact, motor grossly intact throughout PSYCH:  Cognitively intact, oriented to person place and time  EKG:  07/19/11  sinus rhythm,  axis within normal limits, intervals within normal limits, premature ventricular contractions, no acute ST-T wave changes appear  ASSESSMENT AND PLAN

## 2011-08-09 NOTE — Assessment & Plan Note (Signed)
She's not having any symptoms related to this. I will check a 48 hour Holter monitor.

## 2011-08-21 ENCOUNTER — Other Ambulatory Visit: Payer: Self-pay

## 2011-08-21 ENCOUNTER — Encounter (INDEPENDENT_AMBULATORY_CARE_PROVIDER_SITE_OTHER): Payer: Self-pay

## 2011-08-21 ENCOUNTER — Ambulatory Visit (HOSPITAL_COMMUNITY): Payer: Self-pay | Attending: Cardiology | Admitting: Radiology

## 2011-08-21 DIAGNOSIS — I498 Other specified cardiac arrhythmias: Secondary | ICD-10-CM | POA: Insufficient documentation

## 2011-08-21 DIAGNOSIS — R002 Palpitations: Secondary | ICD-10-CM

## 2011-08-21 DIAGNOSIS — I2510 Atherosclerotic heart disease of native coronary artery without angina pectoris: Secondary | ICD-10-CM | POA: Insufficient documentation

## 2011-08-21 DIAGNOSIS — I493 Ventricular premature depolarization: Secondary | ICD-10-CM

## 2011-08-23 ENCOUNTER — Ambulatory Visit: Payer: Self-pay | Attending: Family Medicine | Admitting: Physical Therapy

## 2011-08-23 DIAGNOSIS — M6281 Muscle weakness (generalized): Secondary | ICD-10-CM | POA: Insufficient documentation

## 2011-08-23 DIAGNOSIS — IMO0001 Reserved for inherently not codable concepts without codable children: Secondary | ICD-10-CM | POA: Insufficient documentation

## 2011-08-23 DIAGNOSIS — M25569 Pain in unspecified knee: Secondary | ICD-10-CM | POA: Insufficient documentation

## 2011-08-29 NOTE — Patient Instructions (Signed)
Pt scheduled for follow up 3/12 at 12N

## 2011-09-05 ENCOUNTER — Ambulatory Visit: Payer: Self-pay | Admitting: Rehabilitation

## 2011-09-06 ENCOUNTER — Ambulatory Visit: Payer: Self-pay | Admitting: Physical Therapy

## 2011-09-12 ENCOUNTER — Ambulatory Visit: Payer: Self-pay | Attending: Family Medicine | Admitting: Physical Therapy

## 2011-09-12 DIAGNOSIS — M6281 Muscle weakness (generalized): Secondary | ICD-10-CM | POA: Insufficient documentation

## 2011-09-12 DIAGNOSIS — IMO0001 Reserved for inherently not codable concepts without codable children: Secondary | ICD-10-CM | POA: Insufficient documentation

## 2011-09-12 DIAGNOSIS — M25569 Pain in unspecified knee: Secondary | ICD-10-CM | POA: Insufficient documentation

## 2011-09-14 ENCOUNTER — Ambulatory Visit: Payer: Self-pay | Admitting: Rehabilitation

## 2011-09-19 ENCOUNTER — Encounter: Payer: Self-pay | Admitting: Cardiology

## 2011-09-19 ENCOUNTER — Ambulatory Visit (INDEPENDENT_AMBULATORY_CARE_PROVIDER_SITE_OTHER): Payer: Self-pay | Admitting: Cardiology

## 2011-09-19 VITALS — BP 110/60 | HR 76 | Wt 168.0 lb

## 2011-09-19 DIAGNOSIS — I493 Ventricular premature depolarization: Secondary | ICD-10-CM

## 2011-09-19 DIAGNOSIS — I272 Pulmonary hypertension, unspecified: Secondary | ICD-10-CM | POA: Insufficient documentation

## 2011-09-19 DIAGNOSIS — I4949 Other premature depolarization: Secondary | ICD-10-CM

## 2011-09-19 DIAGNOSIS — I2789 Other specified pulmonary heart diseases: Secondary | ICD-10-CM

## 2011-09-19 MED ORDER — PRENATAL RX 60-1 MG PO TABS: 1.0000 | ORAL_TABLET | Freq: Every day | ORAL | Status: AC

## 2011-09-19 NOTE — Assessment & Plan Note (Signed)
She has no physical findings consistent with this. She's asymptomatic. I will repeat an echocardiogram in 3 months.

## 2011-09-19 NOTE — Progress Notes (Signed)
   HPI The patient presents for evaluation of premature ventricular contractions.  The patient is about [redacted] weeks pregnant. She was noted to have PVCs recently. This is her second visit with me. I applied a Holter monitor which demonstrated ventricular ectopy 26% of the time with ventricular bigeminy. Echocardiography demonstrated a well preserved left ventricular function with some mild pulmonary hypertension and moderate tricuspid regurgitation. The patient again really does not feel her palpitations.  She does have some needlelike discomfort in her left upper chest. She's not having any presyncope or syncope. She denies chest pressure, neck or arm discomfort. She does not have shortness of breath, PND or orthopnea. She's had appropriate weight gain and no significant edema.  No Known Allergies  Current Outpatient Prescriptions  Medication Sig Dispense Refill  . Prenatal Vit-Fe Fumarate-FA (PRENATAL MULTIVITAMIN) 60-1 MG tablet Take 1 tablet by mouth daily.  30 tablet  6    Past Medical History  Diagnosis Date  . Blood transfusion 2009    s/p kidney surgery  . Kidney stone 2009  . Acute meniscal tear, lateral     right leg  . Pain, dental   . Kidney calculus     right staghorn  . Knee effusion, right     joint    Past Surgical History  Procedure Date  . Cesarean section   . Kidney surgery 2009  . Appendectomy   . Nephrolithotomy 12/21/2006    percutaneous nephrolithotomy of right staghorn calculus- carbonate apatite  . Kidney stone surgery 02/20/2007    repeat pcnl for remaining right kidney stone fragments  . Kidney stone surgery 03/19/2007    for futher stone fragment removal    ROS:  As stated in the HPI and negative for all other systems.  PHYSICAL EXAM BP 110/60  Pulse 76  Wt 168 lb (76.204 kg)  LMP 05/26/2011 GENERAL:  Well appearing NECK:  No jugular venous distention, waveform within normal limits, carotid upstroke brisk and symmetric, no bruits, no  thyromegaly LYMPHATICS:  No cervical, inguinal adenopathy LUNGS:  Clear to auscultation bilaterally BACK:  No CVA tenderness, well healed surgical scar CHEST:  Unremarkable HEART:  PMI not displaced or sustained,S1 and S2 within normal limits, no S3, no S4, no clicks, no rubs, no murmurs ABD:  Flat, positive bowel sounds normal in frequency in pitch, no bruits, no rebound, no guarding, no midline pulsatile mass, no hepatomegaly, no splenomegaly, gravid EXT:  2 plus pulses throughout, no edema, no cyanosis no clubbing SKIN:  No rashes no nodules NEURO:  Cranial nerves II through XII grossly intact, motor grossly intact throughout PSYCH:  Cognitively intact, oriented to person place and time  EKG:  Sinus rhythm, rate 77, axis within normal limits, intervals within normal limits, intercalated PVCs, no acute ST-T wave changes. 09/19/2011  ASSESSMENT AND PLAN

## 2011-09-19 NOTE — Patient Instructions (Signed)
Your physician has requested that you have an exercise tolerance test. For further information please visit [email or web address]. Please also follow instruction sheet, as given.  The current medical regimen is effective;  continue present plan and medications.  

## 2011-09-19 NOTE — Assessment & Plan Note (Signed)
She has significant ventricular ectopy though she remains relatively asymptomatic. Labs have been unremarkable. I'm going to bring her back for an exercise treadmill test to see if she has any increase in her ectopy or any sustained dysrhythmias with activity. Otherwise we discussed symptoms that she should alert [place] to if they would develop. Otherwise I will follow as below. No symptomatic therapy is indicated at this point.

## 2011-09-22 ENCOUNTER — Ambulatory Visit: Payer: Self-pay | Admitting: Physical Therapy

## 2011-09-26 ENCOUNTER — Encounter: Payer: Self-pay | Admitting: Rehabilitation

## 2011-09-26 NOTE — Progress Notes (Signed)
Addended by: Judithe Modest D on: 09/26/2011 02:05 PM   Modules accepted: Orders

## 2011-09-27 ENCOUNTER — Encounter: Payer: Self-pay | Admitting: Physical Therapy

## 2011-10-18 ENCOUNTER — Other Ambulatory Visit (HOSPITAL_COMMUNITY): Payer: Self-pay | Admitting: Physician Assistant

## 2011-10-18 DIAGNOSIS — Z3689 Encounter for other specified antenatal screening: Secondary | ICD-10-CM

## 2011-10-23 ENCOUNTER — Ambulatory Visit (HOSPITAL_COMMUNITY)
Admission: RE | Admit: 2011-10-23 | Discharge: 2011-10-23 | Disposition: A | Discharge status: Home / Self Care | Payer: Self-pay | Source: Ambulatory Visit | Attending: Physician Assistant | Admitting: Physician Assistant

## 2011-10-23 DIAGNOSIS — Z1389 Encounter for screening for other disorder: Secondary | ICD-10-CM | POA: Insufficient documentation

## 2011-10-23 DIAGNOSIS — Z363 Encounter for antenatal screening for malformations: Secondary | ICD-10-CM | POA: Insufficient documentation

## 2011-10-23 DIAGNOSIS — Z3689 Encounter for other specified antenatal screening: Secondary | ICD-10-CM

## 2011-10-23 DIAGNOSIS — O34219 Maternal care for unspecified type scar from previous cesarean delivery: Secondary | ICD-10-CM | POA: Insufficient documentation

## 2011-10-23 DIAGNOSIS — O358XX0 Maternal care for other (suspected) fetal abnormality and damage, not applicable or unspecified: Secondary | ICD-10-CM | POA: Insufficient documentation

## 2011-10-27 ENCOUNTER — Encounter: Payer: Self-pay | Admitting: Cardiology

## 2011-10-27 ENCOUNTER — Ambulatory Visit (INDEPENDENT_AMBULATORY_CARE_PROVIDER_SITE_OTHER): Payer: Self-pay | Admitting: Cardiology

## 2011-10-27 DIAGNOSIS — I493 Ventricular premature depolarization: Secondary | ICD-10-CM

## 2011-10-27 DIAGNOSIS — I4949 Other premature depolarization: Secondary | ICD-10-CM

## 2011-10-27 NOTE — Procedures (Deleted)
Exercise Treadmill Test  Pre-Exercise Testing Evaluation Rhythm: normal sinus  Rate: 74   PR:  .14 QRS:  .09  QT:  .39 QTc: .43     Test  Exercise Tolerance Test Ordering MD: Angelina Sheriff, MD  Interpreting MD:  Angelina Sheriff, MD  Unique Test No: 1  Treadmill:  1  Indication for ETT: Palps  Contraindication to ETT: No   Stress Modality: exercise - treadmill  Cardiac Imaging Performed: non   Protocol: standard Bruce - maximal  Max BP:  ***/***  Max MPHR (bpm):  186 85% MPR (bpm):  157  MPHR obtained (bpm):  *** % MPHR obtained:  ***  Reached 85% MPHR (min:sec):  *** Total Exercise Time (min-sec):  ***  Workload in METS:  *** Borg Scale: ***  Reason ETT Terminated:  {CHL REASON TERMINATED FOR ZOX:09604540}    ST Segment Analysis At Rest: {CHL ST SEGMENT AT REST FOR JWJ:19147829} With Exercise: {CHL ST SEGMENT WITH EXERCISE FOR FAO:13086578}  Other Information Arrhythmia:  {CHL ARRHYTHMIA FOR ION:62952841} Angina during ETT:  {CHL ANGINA DURING LKG:40102725} Quality of ETT:  {CHL QUALITY OF DGU:44034742}  ETT Interpretation:  {CHL INTERPRETATION FOR VZD:63875643}  Comments: ***  Recommendations: ***

## 2011-10-27 NOTE — Progress Notes (Signed)
Exercise Treadmill Test  Pre-Exercise Testing Evaluation Rhythm: normal sinus  Rate: 74  PR:  .14 QRS:  .09  QT:  .39 QTc: .43   P axis:  QRS axis:  Normal  ST Segments:       Test  Exercise Tolerance Test Ordering MD: Angelina Sheriff, MD  Interpreting MD:  Angelina Sheriff, MD  Unique Test No: 1 Treadmill:  1  Indication for ETT: palpitations  Contraindication to ETT: No   Stress Modality: exercise - treadmill  Cardiac Imaging Performed: non   Protocol: standard Bruce - maximal  Max BP:  113/42  Max MPHR (bpm):  186 85% MPR (bpm):  157  MPHR obtained (bpm):  157 % MPHR obtained:  85  Reached 85% MPHR (min:sec):  8:59 Total Exercise Time (min-sec):  9:00  Workload in METS:  9 Borg Scale:   Reason ETT Terminated:  desired heart rate attained    ST Segment Analysis At Rest: normal ST segments - no evidence of significant ST depression With Exercise: no evidence of significant ST depression  Other Information Arrhythmia:  Yes Angina during ETT:  absent (0) Quality of ETT:  diagnostic  ETT Interpretation:  normal - no evidence of ischemia by ST analysis  Comments: The patient had an moderate exercise tolerance.  There was no chest pain.  There was an appropriate level of dyspnea.  There was a normal heart rate response and normal BP response.  There were no ischemic ST T wave changes and a normal heart rate recovery.  She did have ventricular ectopy but this did not increase with exercise.  She did not feel the palpitations.  Recommendations: Negative adequate ETT.  No further testing is indicated.  Based on the above I gave the patient a prescription for exercise.  I will avoid further treatment at this point as she is not having symptoms and she is [redacted] weeks pregnant.

## 2011-10-27 NOTE — Progress Notes (Deleted)
   HPI The patient presents for evaluation of premature ventricular contractions.  The patient is about [redacted] weeks pregnant. She was noted to have PVCs recently. This is her second visit with me. I applied a Holter monitor which demonstrated ventricular ectopy 26% of the time with ventricular bigeminy. Echocardiography demonstrated a well preserved left ventricular function with some mild pulmonary hypertension and moderate tricuspid regurgitation. The patient again really does not feel her palpitations.  She does have some needlelike discomfort in her left upper chest. She's not having any presyncope or syncope. She denies chest pressure, neck or arm discomfort. She does not have shortness of breath, PND or orthopnea. She's had appropriate weight gain and no significant edema.  No Known Allergies  Current Outpatient Prescriptions  Medication Sig Dispense Refill  . Prenatal Vit-Fe Fumarate-FA (PRENATAL MULTIVITAMIN) 60-1 MG tablet Take 1 tablet by mouth daily.  30 tablet  6    Past Medical History  Diagnosis Date  . Blood transfusion 2009    s/p kidney surgery  . Kidney stone 2009  . Acute meniscal tear, lateral     right leg  . Pain, dental   . Kidney calculus     right staghorn  . Knee effusion, right     joint    Past Surgical History  Procedure Date  . Cesarean section   . Kidney surgery 2009  . Appendectomy   . Nephrolithotomy 12/21/2006    percutaneous nephrolithotomy of right staghorn calculus- carbonate apatite  . Kidney stone surgery 02/20/2007    repeat pcnl for remaining right kidney stone fragments  . Kidney stone surgery 03/19/2007    for futher stone fragment removal    ROS:  As stated in the HPI and negative for all other systems.  PHYSICAL EXAM LMP 05/26/2011 GENERAL:  Well appearing NECK:  No jugular venous distention, waveform within normal limits, carotid upstroke brisk and symmetric, no bruits, no thyromegaly LYMPHATICS:  No cervical, inguinal  adenopathy LUNGS:  Clear to auscultation bilaterally BACK:  No CVA tenderness, well healed surgical scar CHEST:  Unremarkable HEART:  PMI not displaced or sustained,S1 and S2 within normal limits, no S3, no S4, no clicks, no rubs, no murmurs ABD:  Flat, positive bowel sounds normal in frequency in pitch, no bruits, no rebound, no guarding, no midline pulsatile mass, no hepatomegaly, no splenomegaly, gravid EXT:  2 plus pulses throughout, no edema, no cyanosis no clubbing SKIN:  No rashes no nodules NEURO:  Cranial nerves II through XII grossly intact, motor grossly intact throughout PSYCH:  Cognitively intact, oriented to person place and time  EKG:  Sinus rhythm, rate 77, axis within normal limits, intervals within normal limits, intercalated PVCs, no acute ST-T wave changes. 10/27/2011  ASSESSMENT AND PLAN

## 2012-02-07 ENCOUNTER — Inpatient Hospital Stay (HOSPITAL_COMMUNITY)
Admission: AD | Admit: 2012-02-07 | Discharge: 2012-02-09 | DRG: 775 | Disposition: A | Discharge status: Home / Self Care | Payer: Medicaid Other | Source: Ambulatory Visit | Attending: Obstetrics and Gynecology | Admitting: Obstetrics and Gynecology

## 2012-02-07 ENCOUNTER — Encounter (HOSPITAL_COMMUNITY): Payer: Self-pay

## 2012-02-07 DIAGNOSIS — O34219 Maternal care for unspecified type scar from previous cesarean delivery: Secondary | ICD-10-CM

## 2012-02-07 DIAGNOSIS — Z98891 History of uterine scar from previous surgery: Secondary | ICD-10-CM

## 2012-02-07 DIAGNOSIS — IMO0001 Reserved for inherently not codable concepts without codable children: Secondary | ICD-10-CM

## 2012-02-07 DIAGNOSIS — O42919 Preterm premature rupture of membranes, unspecified as to length of time between rupture and onset of labor, unspecified trimester: Secondary | ICD-10-CM

## 2012-02-07 LAB — CBC
HCT: 40.2 % (ref 36.0–46.0)
MCH: 32.7 pg (ref 26.0–34.0)
MCV: 96.6 fL (ref 78.0–100.0)
RDW: 13.2 % (ref 11.5–15.5)
WBC: 11.3 10*3/uL — ABNORMAL HIGH (ref 4.0–10.5)

## 2012-02-07 LAB — OB RESULTS CONSOLE RUBELLA ANTIBODY, IGM: Rubella: IMMUNE

## 2012-02-07 MED ORDER — OXYCODONE-ACETAMINOPHEN 5-325 MG PO TABS: 1.0000 | ORAL_TABLET | ORAL | Status: DC | PRN

## 2012-02-07 MED ORDER — ONDANSETRON HCL 4 MG PO TABS: 4.0000 mg | ORAL_TABLET | ORAL | Status: DC | PRN

## 2012-02-07 MED ORDER — DIBUCAINE 1 % RE OINT: 1.0000 "application " | TOPICAL_OINTMENT | RECTAL | Status: DC | PRN

## 2012-02-07 MED ORDER — OXYTOCIN 40 UNITS IN LACTATED RINGERS INFUSION - SIMPLE MED
62.5000 mL/h | Freq: Once | INTRAVENOUS | Status: AC
  Administered 2012-02-07: 62.5 mL/h via INTRAVENOUS
  Filled 2012-02-07: qty 1000

## 2012-02-07 MED ORDER — OXYTOCIN BOLUS FROM INFUSION
250.0000 mL | Freq: Once | INTRAVENOUS | Status: DC
  Filled 2012-02-07: qty 500

## 2012-02-07 MED ORDER — FLEET ENEMA 7-19 GM/118ML RE ENEM: 1.0000 | ENEMA | RECTAL | Status: DC | PRN

## 2012-02-07 MED ORDER — METHYLERGONOVINE MALEATE 0.2 MG PO TABS: 0.2000 mg | ORAL_TABLET | ORAL | Status: DC | PRN

## 2012-02-07 MED ORDER — SIMETHICONE 80 MG PO CHEW: 80.0000 mg | CHEWABLE_TABLET | ORAL | Status: DC | PRN

## 2012-02-07 MED ORDER — IBUPROFEN 600 MG PO TABS: 600.0000 mg | ORAL_TABLET | Freq: Four times a day (QID) | ORAL | Status: DC | PRN

## 2012-02-07 MED ORDER — LANOLIN HYDROUS EX OINT: TOPICAL_OINTMENT | CUTANEOUS | Status: DC | PRN

## 2012-02-07 MED ORDER — CITRIC ACID-SODIUM CITRATE 334-500 MG/5ML PO SOLN: 30.0000 mL | ORAL | Status: DC | PRN

## 2012-02-07 MED ORDER — PENICILLIN G POTASSIUM 5000000 UNITS IJ SOLR
5.0000 10*6.[IU] | Freq: Once | INTRAMUSCULAR | Status: AC
  Administered 2012-02-07: 5 10*6.[IU] via INTRAVENOUS
  Filled 2012-02-07: qty 5

## 2012-02-07 MED ORDER — LACTATED RINGERS IV SOLN: 500.0000 mL | INTRAVENOUS | Status: DC | PRN

## 2012-02-07 MED ORDER — LACTATED RINGERS IV SOLN
INTRAVENOUS | Status: DC
  Administered 2012-02-07 (×2): via INTRAVENOUS

## 2012-02-07 MED ORDER — ONDANSETRON HCL 4 MG/2ML IJ SOLN: 4.0000 mg | INTRAMUSCULAR | Status: DC | PRN

## 2012-02-07 MED ORDER — BENZOCAINE-MENTHOL 20-0.5 % EX AERO: 1.0000 "application " | INHALATION_SPRAY | CUTANEOUS | Status: DC | PRN

## 2012-02-07 MED ORDER — ACETAMINOPHEN 325 MG PO TABS: 650.0000 mg | ORAL_TABLET | ORAL | Status: DC | PRN

## 2012-02-07 MED ORDER — ZOLPIDEM TARTRATE 5 MG PO TABS: 5.0000 mg | ORAL_TABLET | Freq: Every evening | ORAL | Status: DC | PRN

## 2012-02-07 MED ORDER — LIDOCAINE HCL (PF) 1 % IJ SOLN
30.0000 mL | INTRAMUSCULAR | Status: DC | PRN
  Filled 2012-02-07: qty 30

## 2012-02-07 MED ORDER — PRENATAL MULTIVITAMIN CH
1.0000 | ORAL_TABLET | Freq: Every day | ORAL | Status: DC
  Administered 2012-02-08 – 2012-02-09 (×2): 1 via ORAL
  Filled 2012-02-07 (×2): qty 1

## 2012-02-07 MED ORDER — PENICILLIN G POTASSIUM 5000000 UNITS IJ SOLR
2.5000 10*6.[IU] | INTRAVENOUS | Status: DC
  Filled 2012-02-07 (×4): qty 2.5

## 2012-02-07 MED ORDER — IBUPROFEN 600 MG PO TABS
600.0000 mg | ORAL_TABLET | Freq: Four times a day (QID) | ORAL | Status: DC
  Administered 2012-02-07 – 2012-02-09 (×7): 600 mg via ORAL
  Filled 2012-02-07 (×7): qty 1

## 2012-02-07 MED ORDER — FENTANYL CITRATE 0.05 MG/ML IJ SOLN: 100.0000 ug | INTRAMUSCULAR | Status: DC | PRN

## 2012-02-07 MED ORDER — ONDANSETRON HCL 4 MG/2ML IJ SOLN: 4.0000 mg | Freq: Four times a day (QID) | INTRAMUSCULAR | Status: DC | PRN

## 2012-02-07 MED ORDER — SENNOSIDES-DOCUSATE SODIUM 8.6-50 MG PO TABS
2.0000 | ORAL_TABLET | Freq: Every day | ORAL | Status: DC
  Administered 2012-02-07 – 2012-02-08 (×2): 2 via ORAL

## 2012-02-07 MED ORDER — DIPHENHYDRAMINE HCL 25 MG PO CAPS: 25.0000 mg | ORAL_CAPSULE | Freq: Four times a day (QID) | ORAL | Status: DC | PRN

## 2012-02-07 MED ORDER — MISOPROSTOL 200 MCG PO TABS
ORAL_TABLET | ORAL | Status: AC
  Administered 2012-02-07: 1000 ug
  Filled 2012-02-07: qty 5

## 2012-02-07 MED ORDER — TETANUS-DIPHTH-ACELL PERTUSSIS 5-2.5-18.5 LF-MCG/0.5 IM SUSP
0.5000 mL | Freq: Once | INTRAMUSCULAR | Status: AC
  Administered 2012-02-08: 0.5 mL via INTRAMUSCULAR
  Filled 2012-02-07: qty 0.5

## 2012-02-07 MED ORDER — METHYLERGONOVINE MALEATE 0.2 MG/ML IJ SOLN: 0.2000 mg | INTRAMUSCULAR | Status: DC | PRN

## 2012-02-07 MED ORDER — WITCH HAZEL-GLYCERIN EX PADS: 1.0000 "application " | MEDICATED_PAD | CUTANEOUS | Status: DC | PRN

## 2012-02-07 NOTE — Progress Notes (Signed)
[facility] Department of Clinical Social Work Documentation of Interpretation   I assisted __Sara RN_________________ with interpretation of _____questions_________________ for this patient. 

## 2012-02-07 NOTE — H&P (Signed)
Melinda Spears is a 36 y.o. female presenting for contractions and LOF. Maternal Medical History:  Reason for admission: Reason for admission: rupture of membranes and contractions.  Contractions: Onset was yesterday.   Frequency: regular.   Perceived severity is moderate.    Fetal activity: Perceived fetal activity is normal.    Prenatal complications: Previous LTCS in [place] for NRFHR, previous successful VBAC, hx PPH   Prenatal Complications - Diabetes: none.    OB History    Grav Para Term Preterm Abortions TAB SAB Ect Mult Living   4 2 2  0 1 0 1 0 0 2     Past Medical History  Diagnosis Date  . Blood transfusion 2009    s/p kidney surgery  . Kidney stone 2009  . Acute meniscal tear, lateral     right leg  . Pain, dental   . Kidney calculus     right staghorn  . Knee effusion, right     joint  . Hx successful VBAC (vaginal birth after cesarean), currently pregnant    Past Surgical History  Procedure Date  . Cesarean section   . Kidney surgery 2009  . Appendectomy   . Nephrolithotomy 12/21/2006    percutaneous nephrolithotomy of right staghorn calculus- carbonate apatite  . Kidney stone surgery 02/20/2007    repeat pcnl for remaining right kidney stone fragments  . Kidney stone surgery 03/19/2007    for futher stone fragment removal   Family History: family history includes Coronary artery disease (age of onset:64) in her father and Diabetes in her father.  There is no history of Other. Social History:  reports that she has never smoked. She has never used smokeless tobacco. She reports that she does not drink alcohol or use illicit drugs.   Prenatal Transfer Tool  Maternal Diabetes: No Genetic Screening: Normal Maternal Ultrasounds/Referrals: Normal Fetal Ultrasounds or other Referrals:  None Maternal Substance Abuse:  No Significant Maternal Medications:  None Significant Maternal Lab Results:  GBS unknown Other Comments:  prev c/s with successful  VBAC, Hx PPH  Review of Systems  Constitutional: Negative.   HENT: Negative.   Eyes: Negative.   Respiratory: Negative.   Cardiovascular: Negative.   Gastrointestinal: Positive for abdominal pain.  Genitourinary:       LOF clear since yesterday   Musculoskeletal: Negative.   Neurological: Negative.   Psychiatric/Behavioral: Negative.     Dilation: 1.5 Effacement (%): 80 Station: -1 Exam by:: SShores, CNM Blood pressure 121/76, pulse 80, temperature 97.7 F (36.5 C), temperature source Oral, resp. rate 20, height 5\' 4"  (1.626 m), weight 191 lb (86.637 kg), last menstrual period 05/26/2011, SpO2 99.00%. Maternal Exam:  Uterine Assessment: Contraction strength is moderate.  Contraction frequency is regular.   Abdomen: Patient reports no abdominal tenderness. Fetal presentation: vertex  Introitus: Normal vulva. Normal vagina.  Ferning test: positive.  Nitrazine test: not done. Amniotic fluid character: clear.  Pelvis: adequate for delivery.   Cervix: Cervix evaluated by digital exam.   1-2/80/vtx/-1  Physical Exam  Constitutional: She is oriented to person, place, and time. She appears well-developed and well-nourished.  HENT:  Head: Normocephalic and atraumatic.  Cardiovascular: Normal rate, regular rhythm and normal heart sounds.   Respiratory: Effort normal and breath sounds normal.  GI:       Gravid, non tender  Genitourinary: Vagina normal and uterus normal.  Musculoskeletal: Normal range of motion.  Neurological: She is alert and oriented to person, place, and time. She has normal reflexes.  Skin: Skin is warm and dry.  Psychiatric: She has a normal mood and affect. Her behavior is normal. Judgment and thought content normal.    Prenatal labs: ABO, Rh:  O positive  Antibody:  Neg Rubella:  Imm RPR:   Neg HBsAg:   Neg HIV:   NR GBS:   Unknown  Assessment/Plan: Admit to BS  PCN for unknown GBS status less than [redacted] weeks gestation Expectant management No  BMZ secondary to gestational age >48 weeks  Melinda Spears E. 02/07/2012, 11:44 AM

## 2012-02-07 NOTE — MAU Note (Signed)
SShores, CNM at bedside

## 2012-02-08 LAB — CBC
HCT: 34.8 % — ABNORMAL LOW (ref 36.0–46.0)
MCH: 32.7 pg (ref 26.0–34.0)
MCV: 98 fL (ref 78.0–100.0)
RBC: 3.55 MIL/uL — ABNORMAL LOW (ref 3.87–5.11)
RDW: 13.7 % (ref 11.5–15.5)
WBC: 8.6 10*3/uL (ref 4.0–10.5)

## 2012-02-08 NOTE — H&P (Signed)
Agree with above note.  Melinda Spears 02/08/2012 9:56 AM

## 2012-02-08 NOTE — Progress Notes (Signed)
UR chart review completed.  

## 2012-02-08 NOTE — Discharge Summary (Signed)
Obstetric Discharge Summary Reason for Admission: onset of labor Prenatal Procedures: none Intrapartum Procedures: spontaneous vaginal delivery Postpartum Procedures: none Complications-Operative and Postpartum: none Hemoglobin  Date Value Range Status  02/08/2012 11.6* 12.0 - 15.0 g/dL Final     HCT  Date Value Range Status  02/08/2012 34.8* 36.0 - 46.0 % Final    Physical Exam:  General: alert Lochia: appropriate Uterine Fundus: firm Incision: none DVT Evaluation: No evidence of DVT seen on physical exam.  Discharge Diagnoses: Term Pregnancy-delivered  Discharge Information: Date: 02/08/2012 Activity: unrestricted Diet: routine Medications: None Condition: stable Instructions: refer to practice specific booklet Discharge to: home   Newborn Data: Live born female  Birth Weight: 5 lb 7.7 oz (2487 g) APGAR: 8, 9  Home with mother.  HOYLE, JASON 02/08/2012, 7:33 AM  Patient seen and examined.  Agree with above note.  Levie Heritage, DO 02/09/2012 8:53 AM

## 2012-02-08 NOTE — Progress Notes (Signed)
Post Partum Day 1 Subjective: no complaints  Objective: Blood pressure 124/72, pulse 76, temperature 97.7 F (36.5 C), temperature source Oral, resp. rate 18, height 5\' 4"  (1.626 m), weight 86.637 kg (191 lb), last menstrual period 05/26/2011, SpO2 96.00%, unknown if currently breastfeeding.  Physical Exam:  General: alert Lochia: appropriate Uterine Fundus: firm Incision: None DVT Evaluation: No evidence of DVT seen on physical exam.   Basename 02/08/12 0555 02/07/12 1143  HGB 11.6* 13.6  HCT 34.8* 40.2    Assessment/Plan: Plan for discharge tomorrow   LOS: 1 day   Melinda Spears 02/08/2012, 7:56 AM

## 2012-02-09 LAB — TYPE AND SCREEN
ABO/RH(D): O POS
Antibody Screen: NEGATIVE
Unit division: 0
Unit division: 0

## 2012-02-09 MED ORDER — IBUPROFEN 600 MG PO TABS: 600.0000 mg | ORAL_TABLET | Freq: Four times a day (QID) | ORAL | Status: AC

## 2012-02-09 NOTE — Clinical Social Work Maternal (Signed)
Clinical Social Work Department PSYCHOSOCIAL ASSESSMENT - MATERNAL/CHILD 02/08/2012  Patient:  Melinda Spears,Melinda Spears  Account Number:  [id number]  Admit Date:  02/07/2012  Childs Name:   Melinda Spears    Clinical Social Worker:  Alonia Dibuono, LCSW   Date/Time:  02/08/2012 03:40 PM  Date Referred:  02/08/2012   Referral source  NICU     Referred reason  NICU   Other referral source:    I:  FAMILY / HOME ENVIRONMENT Child's legal guardian:  PARENT  Guardian - Name Guardian - Age Guardian - Address  Melinda Spears 36 [address], [place], [place] [address]   Other household support members/support persons Other support:   MOB has two other children living at home.  She reports her brother and sister as her greatest support people.    II  PSYCHOSOCIAL DATA Information Source:  Patient Interview  Financial and Community Resources Employment:   Financial resources:   If Medicaid - County:    School / Grade:   Maternity Care Coordinator / Child Services Coordination / Early Interventions:  Cultural issues impacting care:   Family is Spanish speaking.    III  STRENGTHS Strengths  Adequate Resources  Compliance with medical plan  Supportive family/friends  Understanding of illness   Strength comment:    IV  RISK FACTORS AND CURRENT PROBLEMS Current Problem:  None   Risk Factor & Current Problem Patient Issue Family Issue Risk Factor / Current Problem Comment   N N     V  SOCIAL WORK ASSESSMENT SW met with MOB in her first floor room/122 with the assistance of [employer] telephonic Spanish interpreter, since hospital interpreter was not available, to introduce myself, complete assessment evaluate how family is coping with baby's admission to NICU.  MOB was very quiet and pleasant.  She states that this is her first experience with the NICU and having Spears baby prematurely.  She reports having Spears good support system and states that she will have transportation to and  from the hospital after her discharge to visit with baby.  She states she understands visitation policy and to ask for an interpreter whenever she feels she needs to communicate with any member of the medical team. She states she does not have any baby supplies at this time and will work on getting things together.  SW informed her of the possibility of utilizing [employer] through [facility] [employer] to obtain needed items.  She will let SW know or [facility] [employer] [facility] staff know of any needs.  SW briefly discussed what to expect from the NICU experience in general terms and explained that the baby's doctors, nurses and practitioners would give her specific information for her baby as he progresses.  She seemed to appreciate SW's intervention and appears to understand the situation better at at this point.  SW explained support services offered by NICU SW and gave contact information. SW asked MOB to state her name when she calls SW or leave her name on SW's voicemail and SW will have an interpreter return her call at any time.  She states no questions or needs at this time.      VI SOCIAL WORK PLAN Social Work Plan  Information/Referral to Community Resources  Psychosocial Support/Ongoing Assessment of Needs   Type of pt/family education:   What to expect from Spears NICU admission   If child protective services report - county:   If child protective services report - date:     Information/referral to community resources comment:   [facility] [employer] [facility]/[employer]   Other social work plan:     

## 2012-02-09 NOTE — Progress Notes (Signed)
[facility] Department of Clinical Social Work Documentation of Interpretation   I assisted __Lisa RN_________________ with interpretation of _discharge_____________________ for this patient.

## 2012-02-12 NOTE — Progress Notes (Signed)
Agree with above note.  Melinda Spears 02/12/2012 2:33 PM

## 2012-02-27 ENCOUNTER — Ambulatory Visit: Payer: Self-pay | Admitting: Cardiology

## 2012-03-27 ENCOUNTER — Encounter: Payer: Self-pay | Admitting: Cardiology

## 2012-03-27 ENCOUNTER — Ambulatory Visit (INDEPENDENT_AMBULATORY_CARE_PROVIDER_SITE_OTHER): Payer: No Typology Code available for payment source | Admitting: Cardiology

## 2012-03-27 VITALS — BP 120/47 | HR 68 | Ht 60.0 in | Wt 177.8 lb

## 2012-03-27 DIAGNOSIS — I272 Pulmonary hypertension, unspecified: Secondary | ICD-10-CM

## 2012-03-27 DIAGNOSIS — I079 Rheumatic tricuspid valve disease, unspecified: Secondary | ICD-10-CM

## 2012-03-27 DIAGNOSIS — I2789 Other specified pulmonary heart diseases: Secondary | ICD-10-CM

## 2012-03-27 DIAGNOSIS — I4949 Other premature depolarization: Secondary | ICD-10-CM

## 2012-03-27 DIAGNOSIS — I493 Ventricular premature depolarization: Secondary | ICD-10-CM | POA: Insufficient documentation

## 2012-03-27 DIAGNOSIS — I071 Rheumatic tricuspid insufficiency: Secondary | ICD-10-CM | POA: Insufficient documentation

## 2012-03-27 NOTE — Progress Notes (Signed)
   HPI The patient presents for evaluation of premature ventricular contractions.  She has also had mild pulmonary HTN.  Since I last saw her she delivered a baby boy. She had no problems with the delivery. She no longer is feeling any palpitations. She's had no presyncope or syncope. She's had no chest pressure, neck or arm discomfort. She denies any shortness of breath, PND or orthopnea. He has had no weight gain or edema.  No Known Allergies  Current Outpatient Prescriptions  Medication Sig Dispense Refill  . acetaminophen (TYLENOL) 325 MG tablet Take 325 mg by mouth every 6 (six) hours as needed. Takes for pain      . ibuprofen (ADVIL,MOTRIN) 200 MG tablet Take 200 mg by mouth every 6 (six) hours as needed.      . Prenatal Vit-Fe Fumarate-FA (PRENATAL MULTIVITAMIN) 60-1 MG tablet Take 1 tablet by mouth daily.  30 tablet  6    Past Medical History  Diagnosis Date  . Blood transfusion 2009    s/p kidney surgery  . Kidney stone 2009  . Acute meniscal tear, lateral     right leg  . Pain, dental   . Kidney calculus     right staghorn  . Knee effusion, right     joint  . Hx successful VBAC (vaginal birth after cesarean), currently pregnant     Past Surgical History  Procedure Date  . Cesarean section   . Kidney surgery 2009  . Appendectomy   . Nephrolithotomy 12/21/2006    percutaneous nephrolithotomy of right staghorn calculus- carbonate apatite  . Kidney stone surgery 02/20/2007    repeat pcnl for remaining right kidney stone fragments  . Kidney stone surgery 03/19/2007    for futher stone fragment removal    ROS:  As stated in the HPI and negative for all other systems.  PHYSICAL EXAM BP 120/47  Pulse 68  Ht 5' (1.524 m)  Wt 177 lb 12.8 oz (80.65 kg)  BMI 34.72 kg/m2  Breastfeeding? Yes GENERAL:  Well appearing NECK:  No jugular venous distention, waveform within normal limits, carotid upstroke brisk and symmetric, no bruits, no thyromegaly LYMPHATICS:  No cervical,  inguinal adenopathy LUNGS:  Clear to auscultation bilaterally CHEST:  Unremarkable HEART:  PMI not displaced or sustained,S1 and S2 within normal limits, no S3, no S4, no clicks, no rubs, no murmurs ABD:  Flat, positive bowel sounds normal in frequency in pitch, no bruits, no rebound, no guarding, no midline pulsatile mass, no hepatomegaly, no splenomegaly.   EXT:  2 plus pulses throughout, no edema, no cyanosis no clubbing SKIN:  No rashes no nodules   EKG:  Sinus rhythm, rate 68, axis within normal limits, intervals within normal limits, he is with a  no acute ST-T wave changes. 03/27/2012  ASSESSMENT AND PLAN  PVCs - She is no longer bothered by these. No change in therapy is indicated.   PULMONARY HTN - She had hypertension with moderate tricuspid regurgitation. I will repeat an echocardiogram to followup on this. This has normalized he'll be no change in therapy or further evaluation warranted.

## 2012-03-27 NOTE — Patient Instructions (Addendum)
The current medical regimen is effective;  continue present plan and medications.  Your physician has requested that you have an echocardiogram. Echocardiography is a painless test that uses sound waves to create images of your heart. It provides your doctor with information about the size and shape of your heart and how well your heart's chambers and valves are working. This procedure takes approximately one hour. There are no restrictions for this procedure.  Follow up as needed. 

## 2012-04-02 ENCOUNTER — Ambulatory Visit (HOSPITAL_COMMUNITY): Payer: Self-pay | Attending: Cardiology

## 2012-04-02 DIAGNOSIS — I272 Pulmonary hypertension, unspecified: Secondary | ICD-10-CM

## 2012-04-02 DIAGNOSIS — I2789 Other specified pulmonary heart diseases: Secondary | ICD-10-CM | POA: Insufficient documentation

## 2012-04-02 DIAGNOSIS — I4949 Other premature depolarization: Secondary | ICD-10-CM | POA: Insufficient documentation

## 2012-04-02 DIAGNOSIS — I059 Rheumatic mitral valve disease, unspecified: Secondary | ICD-10-CM | POA: Insufficient documentation

## 2012-04-02 DIAGNOSIS — I369 Nonrheumatic tricuspid valve disorder, unspecified: Secondary | ICD-10-CM | POA: Insufficient documentation

## 2012-04-02 NOTE — Progress Notes (Signed)
Echocardiogram performed.  

## 2012-05-07 ENCOUNTER — Telehealth: Payer: Self-pay | Admitting: *Deleted

## 2012-05-07 NOTE — Telephone Encounter (Signed)
Message copied by Sharin Grave on Tue May 07, 2012 10:41 AM ------      Message from: Rollene Rotunda      Created: Sun Apr 07, 2012  2:25 PM       Can you call her.  She had no significant TR or elevated pulmonary pressure.  Send results to primary MD.

## 2012-05-08 NOTE — Telephone Encounter (Signed)
Pt doesn't speak English and doesn't have any one with her to interrupt  Will locate someone in language services to call her with results.

## 2012-05-14 ENCOUNTER — Encounter: Payer: Self-pay | Admitting: *Deleted

## 2012-05-14 NOTE — Telephone Encounter (Signed)
Pt was called and messages were left (in Spanish by Ollen Gross, RN) for her to call back for results.  There were no return calls.  A letter in spanish will be mailed to pts home address.

## 2012-12-02 ENCOUNTER — Emergency Department (HOSPITAL_COMMUNITY): Payer: Medicaid Other

## 2012-12-02 ENCOUNTER — Inpatient Hospital Stay (HOSPITAL_COMMUNITY)
Admission: EM | Admit: 2012-12-02 | Discharge: 2012-12-05 | DRG: 871 | Disposition: A | Discharge status: Home / Self Care | Payer: Medicaid Other | Attending: Family Medicine | Admitting: Family Medicine

## 2012-12-02 ENCOUNTER — Encounter (HOSPITAL_COMMUNITY): Payer: Self-pay

## 2012-12-02 DIAGNOSIS — E876 Hypokalemia: Secondary | ICD-10-CM | POA: Diagnosis present

## 2012-12-02 DIAGNOSIS — A419 Sepsis, unspecified organism: Secondary | ICD-10-CM | POA: Diagnosis present

## 2012-12-02 DIAGNOSIS — N949 Unspecified condition associated with female genital organs and menstrual cycle: Secondary | ICD-10-CM

## 2012-12-02 DIAGNOSIS — D649 Anemia, unspecified: Secondary | ICD-10-CM | POA: Diagnosis present

## 2012-12-02 DIAGNOSIS — A4159 Other Gram-negative sepsis: Principal | ICD-10-CM | POA: Diagnosis present

## 2012-12-02 DIAGNOSIS — A4150 Gram-negative sepsis, unspecified: Secondary | ICD-10-CM

## 2012-12-02 DIAGNOSIS — J189 Pneumonia, unspecified organism: Secondary | ICD-10-CM | POA: Diagnosis present

## 2012-12-02 DIAGNOSIS — R000 Tachycardia, unspecified: Secondary | ICD-10-CM | POA: Diagnosis present

## 2012-12-02 DIAGNOSIS — R109 Unspecified abdominal pain: Secondary | ICD-10-CM | POA: Diagnosis present

## 2012-12-02 DIAGNOSIS — Q619 Cystic kidney disease, unspecified: Secondary | ICD-10-CM

## 2012-12-02 DIAGNOSIS — N12 Tubulo-interstitial nephritis, not specified as acute or chronic: Secondary | ICD-10-CM | POA: Diagnosis present

## 2012-12-02 DIAGNOSIS — I959 Hypotension, unspecified: Secondary | ICD-10-CM | POA: Diagnosis present

## 2012-12-02 DIAGNOSIS — I10 Essential (primary) hypertension: Secondary | ICD-10-CM | POA: Diagnosis present

## 2012-12-02 DIAGNOSIS — N281 Cyst of kidney, acquired: Secondary | ICD-10-CM

## 2012-12-02 LAB — CG4 I-STAT (LACTIC ACID): Lactic Acid, Venous: 1.5 mmol/L (ref 0.5–2.2)

## 2012-12-02 LAB — COMPREHENSIVE METABOLIC PANEL
Alkaline Phosphatase: 74 U/L (ref 39–117)
BUN: 17 mg/dL (ref 6–23)
Calcium: 8.5 mg/dL (ref 8.4–10.5)
Creatinine, Ser: 1.04 mg/dL (ref 0.50–1.10)
GFR calc Af Amer: 80 mL/min — ABNORMAL LOW (ref >90)
Glucose, Bld: 122 mg/dL — ABNORMAL HIGH (ref 70–99)
Total Protein: 7 g/dL (ref 6.0–8.3)

## 2012-12-02 LAB — POCT I-STAT, CHEM 8
Glucose, Bld: 123 mg/dL — ABNORMAL HIGH (ref 70–99)
HCT: 38 % (ref 36.0–46.0)
Hemoglobin: 12.9 g/dL (ref 12.0–15.0)
Potassium: 3.3 mEq/L — ABNORMAL LOW (ref 3.5–5.1)
Sodium: 137 mEq/L (ref 135–145)

## 2012-12-02 LAB — CBC WITH DIFFERENTIAL/PLATELET
Basophils Relative: 0 % (ref 0–1)
Eosinophils Absolute: 0 10*3/uL (ref 0.0–0.7)
Eosinophils Relative: 0 % (ref 0–5)
Hemoglobin: 12.8 g/dL (ref 12.0–15.0)
Lymphs Abs: 0.5 10*3/uL — ABNORMAL LOW (ref 0.7–4.0)
MCH: 32.4 pg (ref 26.0–34.0)
MCHC: 34 g/dL (ref 30.0–36.0)
MCV: 95.4 fL (ref 78.0–100.0)
Monocytes Relative: 4 % (ref 3–12)
RBC: 3.95 MIL/uL (ref 3.87–5.11)

## 2012-12-02 LAB — URINALYSIS, ROUTINE W REFLEX MICROSCOPIC
Bilirubin Urine: NEGATIVE
Ketones, ur: NEGATIVE mg/dL
Nitrite: NEGATIVE
Protein, ur: 100 mg/dL — AB
pH: 6 (ref 5.0–8.0)

## 2012-12-02 LAB — MAGNESIUM: Magnesium: 1.5 mg/dL (ref 1.5–2.5)

## 2012-12-02 LAB — URINE MICROSCOPIC-ADD ON

## 2012-12-02 LAB — POCT PREGNANCY, URINE: Preg Test, Ur: NEGATIVE

## 2012-12-02 MED ORDER — SODIUM CHLORIDE 0.9 % IV BOLUS (SEPSIS)
1000.0000 mL | Freq: Once | INTRAVENOUS | Status: AC
  Administered 2012-12-02: 1000 mL via INTRAVENOUS

## 2012-12-02 MED ORDER — HYDROCODONE-ACETAMINOPHEN 5-325 MG PO TABS
1.0000 | ORAL_TABLET | ORAL | Status: DC | PRN
  Administered 2012-12-03: 1 via ORAL
  Filled 2012-12-02: qty 1

## 2012-12-02 MED ORDER — IOHEXOL 300 MG/ML  SOLN: 25.0000 mL | Freq: Once | INTRAMUSCULAR | Status: AC | PRN

## 2012-12-02 MED ORDER — ACETAMINOPHEN 325 MG PO TABS
650.0000 mg | ORAL_TABLET | Freq: Four times a day (QID) | ORAL | Status: DC | PRN
  Administered 2012-12-02: 650 mg via ORAL
  Filled 2012-12-02: qty 2

## 2012-12-02 MED ORDER — HYDROMORPHONE HCL PF 2 MG/ML IJ SOLN: 2.0000 mg | INTRAMUSCULAR | Status: DC | PRN

## 2012-12-02 MED ORDER — DEXTROSE 5 % IV SOLN
1.0000 g | Freq: Once | INTRAVENOUS | Status: AC
  Administered 2012-12-02: 1 g via INTRAVENOUS
  Filled 2012-12-02: qty 10

## 2012-12-02 MED ORDER — ACETAMINOPHEN 325 MG PO TABS
650.0000 mg | ORAL_TABLET | Freq: Four times a day (QID) | ORAL | Status: DC | PRN
  Administered 2012-12-03: 650 mg via ORAL
  Filled 2012-12-02: qty 2

## 2012-12-02 MED ORDER — ONDANSETRON HCL 4 MG/2ML IJ SOLN
4.0000 mg | Freq: Four times a day (QID) | INTRAMUSCULAR | Status: DC | PRN
  Administered 2012-12-03: 4 mg via INTRAVENOUS
  Filled 2012-12-02: qty 2

## 2012-12-02 MED ORDER — ONDANSETRON HCL 4 MG/2ML IJ SOLN: 4.0000 mg | Freq: Three times a day (TID) | INTRAMUSCULAR | Status: DC | PRN

## 2012-12-02 MED ORDER — SODIUM CHLORIDE 0.9 % IV SOLN
INTRAVENOUS | Status: AC
  Administered 2012-12-02: 22:00:00 via INTRAVENOUS

## 2012-12-02 MED ORDER — SODIUM CHLORIDE 0.9 % IV SOLN
INTRAVENOUS | Status: DC
  Administered 2012-12-03 (×2): via INTRAVENOUS

## 2012-12-02 MED ORDER — HYDROMORPHONE HCL PF 1 MG/ML IJ SOLN: 2.0000 mg | INTRAMUSCULAR | Status: DC | PRN

## 2012-12-02 MED ORDER — POTASSIUM CHLORIDE CRYS ER 20 MEQ PO TBCR
40.0000 meq | EXTENDED_RELEASE_TABLET | Freq: Once | ORAL | Status: AC
  Administered 2012-12-02: 40 meq via ORAL
  Filled 2012-12-02: qty 2

## 2012-12-02 MED ORDER — DEXTROSE 5 % IV SOLN: 1.0000 g | INTRAVENOUS | Status: DC

## 2012-12-02 MED ORDER — ACETAMINOPHEN 650 MG RE SUPP: 650.0000 mg | Freq: Four times a day (QID) | RECTAL | Status: DC | PRN

## 2012-12-02 MED ORDER — ONDANSETRON HCL 4 MG PO TABS
4.0000 mg | ORAL_TABLET | Freq: Four times a day (QID) | ORAL | Status: DC | PRN
  Administered 2012-12-04: 4 mg via ORAL
  Filled 2012-12-02: qty 1

## 2012-12-02 MED ORDER — IOHEXOL 300 MG/ML  SOLN
25.0000 mL | INTRAMUSCULAR | Status: AC
  Administered 2012-12-02 – 2012-12-03 (×2): 25 mL via ORAL

## 2012-12-02 MED ORDER — SODIUM CHLORIDE 0.9 % IJ SOLN
3.0000 mL | Freq: Two times a day (BID) | INTRAMUSCULAR | Status: DC
  Administered 2012-12-03 – 2012-12-05 (×3): 3 mL via INTRAVENOUS

## 2012-12-02 NOTE — ED Notes (Signed)
Md aware pt map is <65, MD does not want CCM involved at this time. Will reassess frequently.

## 2012-12-02 NOTE — ED Provider Notes (Signed)
History     CSN: [id number]  Arrival date & time 12/02/12  1807   First MD Initiated Contact with Patient 12/02/12 1829      Chief Complaint  Patient presents with  . Flank Pain    (Consider location/radiation/quality/duration/timing/severity/associated sxs/prior treatment) HPI Comments: Patient is a 37 year old female with no significant past medical history other than kidney stones who presents with a complaint of flank pain, fever and weakness. Symptoms started 4 days ago, gradual onset, persistent, gradually worsening and now severe. The patient was found to be hypotensive in triage, febrile to 101.2 and tachycardic to 125.  Patient is a 37 y.o. female presenting with flank pain. The history is provided by the patient and the spouse.  Flank Pain    Past Medical History  Diagnosis Date  . Blood transfusion 2009    s/p kidney surgery  . Acute meniscal tear, lateral     right leg  . Pain, dental   . Knee effusion, right     joint  . Hx successful VBAC (vaginal birth after cesarean), currently pregnant   . Kidney stone 2009  . Kidney calculus     right staghorn    Past Surgical History  Procedure Laterality Date  . Cesarean section    . Kidney surgery  2009  . Appendectomy    . Nephrolithotomy  12/21/2006    percutaneous nephrolithotomy of right staghorn calculus- carbonate apatite  . Kidney stone surgery  02/20/2007    repeat pcnl for remaining right kidney stone fragments  . Kidney stone surgery  03/19/2007    for futher stone fragment removal    Family History  Problem Relation Age of Onset  . Diabetes Father   . Coronary artery disease Father 63  . Other Neg Hx     History  Substance Use Topics  . Smoking status: Never Smoker   . Smokeless tobacco: Never Used  . Alcohol Use: No    OB History   Grav Para Term Preterm Abortions TAB SAB Ect Mult Living   4 3 2 1 1  0 1 0 0 3      Review of Systems  Genitourinary: Positive for flank pain.  All other  systems reviewed and are negative.    Allergies  Review of patient's allergies indicates no known allergies.  Home Medications   Current Outpatient Rx  Name  Route  Sig  Dispense  Refill  . NAPROXEN PO   Oral   Take 2 tablets by mouth every 4 (four) hours as needed (for pain).           BP 93/43  Pulse 91  Temp(Src) 99.5 F (37.5 C) (Oral)  Resp 24  Ht 5\' 1"  (1.549 m)  Wt 178 lb (80.74 kg)  BMI 33.65 kg/m2  SpO2 98%  Physical Exam  Nursing note and vitals reviewed. Constitutional: She appears well-developed and well-nourished.  Ill-appearing, hypotensive  HENT:  Head: Normocephalic and atraumatic.  Mouth/Throat: Oropharynx is clear and moist. No oropharyngeal exudate.  Eyes: Conjunctivae and EOM are normal. Pupils are equal, round, and reactive to light. Right eye exhibits no discharge. Left eye exhibits no discharge. No scleral icterus.  Neck: Normal range of motion. Neck supple. No JVD present. No thyromegaly present.  Cardiovascular: Regular rhythm, normal heart sounds and intact distal pulses.  Exam reveals no gallop and no friction rub.   No murmur heard. Tachycardic to 125, sinus tachycardia, weak pulses at the radial arteries  Pulmonary/Chest: Effort normal and  breath sounds normal. No respiratory distress. She has no wheezes. She has no rales.  Abdominal: Soft. Bowel sounds are normal. She exhibits no distension and no mass. There is tenderness ( Left upper quadrant, left lower quadrant and suprapubic in).  CVA tenderness on the left present  Musculoskeletal: Normal range of motion. She exhibits no edema and no tenderness.  Lymphadenopathy:    She has no cervical adenopathy.  Neurological: She is alert. Coordination normal.  Skin: Skin is warm and dry. No rash noted. No erythema.  Psychiatric: She has a normal mood and affect. Her behavior is normal.    ED Course  Procedures (including critical care time)  Labs Reviewed  CBC WITH DIFFERENTIAL -  Abnormal; Notable for the following:    Neutrophils Relative % 90 (*)    Neutro Abs 7.9 (*)    Lymphocytes Relative 6 (*)    Lymphs Abs 0.5 (*)    All other components within normal limits  COMPREHENSIVE METABOLIC PANEL - Abnormal; Notable for the following:    Sodium 134 (*)    Potassium 3.4 (*)    Glucose, Bld 122 (*)    Albumin 3.2 (*)    GFR calc non Af Amer 69 (*)    GFR calc Af Amer 80 (*)    All other components within normal limits  URINALYSIS, ROUTINE W REFLEX MICROSCOPIC - Abnormal; Notable for the following:    APPearance HAZY (*)    Hgb urine dipstick MODERATE (*)    Protein, ur 100 (*)    Leukocytes, UA LARGE (*)    All other components within normal limits  URINE MICROSCOPIC-ADD ON - Abnormal; Notable for the following:    Squamous Epithelial / LPF MANY (*)    Bacteria, UA MANY (*)    All other components within normal limits  POCT I-STAT, CHEM 8 - Abnormal; Notable for the following:    Potassium 3.3 (*)    Glucose, Bld 123 (*)    Calcium, Ion 1.06 (*)    All other components within normal limits  CULTURE, BLOOD (ROUTINE X 2)  CULTURE, BLOOD (ROUTINE X 2)  URINE CULTURE  LACTIC ACID, PLASMA  POCT PREGNANCY, URINE  CG4 I-STAT (LACTIC ACID)   Dg Chest Port 1 View  (if Code Sepsis Called)  12/02/2012   *RADIOLOGY REPORT*  Clinical Data: Flank pain.  PORTABLE CHEST - 1 VIEW  Comparison: None.  Findings: Cardiomediastinal silhouette is within normal limits. The lungs are free of focal consolidations and pleural effusions. No edema.  IMPRESSION: Negative exam.   Original Report Authenticated By: Norva Pavlov, M.D.     1. Sepsis   2. Pyelonephritis       MDM  The patient does appear critically ill, a code sepsis has been called secondary to her abnormal vital signs and the likelihood of a urinary infection and pyelonephritis. She will require IV fluid resuscitation for hypotension, lactic acid, antibiotics.   Patient has been given IV fluids , multiple  boluses, IV antibiotics immediately as the patient was triaged as a code sepsis. Thank for there is no leukocytosis however her urinalysis confirms that she has a urinary tract infection with her CVA tenderness and sepsis this is pyelonephritis.  The patient has been reevaluated multiple times, multiple fluid boluses, continues to be slightly hypotensive at around 95 systolic, critical care spent provided. Also patient with the hospitalist has been requested and they have agreed to admit the patient.  CRITICAL CARE Performed by: Vida Roller Total  critical care time: 35 Critical care time was exclusive of separately billable procedures and treating other patients. Critical care was necessary to treat or prevent imminent or life-threatening deterioration. Critical care was time spent personally by me on the following activities: development of treatment plan with patient and/or surrogate as well as nursing, discussions with consultants, evaluation of patient's response to treatment, examination of patient, obtaining history from patient or surrogate, ordering and performing treatments and interventions, ordering and review of laboratory studies, ordering and review of radiographic studies, pulse oximetry and re-evaluation of patient's condition.   Meds given in ED:  Medications  acetaminophen (TYLENOL) tablet 650 mg (650 mg Oral Given 12/02/12 1913)  cefTRIAXone (ROCEPHIN) 1 g in dextrose 5 % 50 mL IVPB (0 g Intravenous Stopped 12/02/12 1932)  sodium chloride 0.9 % bolus 1,000 mL (0 mLs Intravenous Stopped 12/02/12 2009)  sodium chloride 0.9 % bolus 1,000 mL (0 mLs Intravenous Stopped 12/02/12 2000)  sodium chloride 0.9 % bolus 1,000 mL (1,000 mLs Intravenous New Bag/Given 12/02/12 2044)    New Prescriptions   No medications on file      Vida Roller, MD 12/02/12 2118

## 2012-12-02 NOTE — ED Notes (Addendum)
C/o left flank pain x4 days. Denies n/v/d. Pt also c/o diffuse abd pain, fever, chills, body aches

## 2012-12-02 NOTE — ED Notes (Signed)
Both sets of blood cultures drawn. 

## 2012-12-02 NOTE — ED Notes (Signed)
Attempted report 

## 2012-12-02 NOTE — Progress Notes (Signed)
Pharmacy response to code sepsis: ceftriaxone ordered, pulled from pyxis, given to RN (who was drawing cultures).

## 2012-12-02 NOTE — H&P (Signed)
[facility] History and Physical  Melinda Spears ZOX:[id number] DOB: [birth year] DOA: 12/02/2012  Referring physician: ER physician PCP: No primary provider on file.   Chief Complaint: left flank area pain  HPI:  Pt is 37 yo female with no significant past medical history, presents to ED with main concern of 4 days progressively worsening left flank pain, constant and sharp in nature, radiating to left upper and lower abdominal quadrant, 10/10 in severity, associated with nausea and non bloody vomiting, malaise and poor oral intake, subjective fevers, chills. Pt denies similar events in the past. She denies any specific urinary concerns, no chest pain or shortness of breath, no other systemic symptoms..  In ED, pt tachycardic with HR in 130's, hypotensive with SBP in 80's, responded well to 3 L NS boluses. TR[facility] asked to admit for further evaluation and management.  Principal Problem:   Flank pain, tachycardia, hypotension, sepsis secondary to pyelonephritis  - most likely secondary to pyelonephritis - will obtain CT abdomen and pelvis as well for clearer evaluation - will start Rocephin IV, obtain urine culture - supportive care with IVF, analgesia and antiemetics as needed - follow up on blood culture Active Problems:   Pyelonephritis - treatment as noted above - follow up on CT abd/pelvis - ABX Rocephin IV   Sepsis - responded well to IVF, NS boluses, received 3 L NS - will continue IVF, follow up on procalcitonin level - lactic acid is within normal limits    Hypokalemia - mild, will supplement and repeat BMP in AM  Review of Systems:  Constitutional: Positive for fever, chills and malaise/fatigue. Negative for diaphoresis.  HENT: Negative for hearing loss, ear pain, nosebleeds, congestion, sore throat, neck pain, tinnitus and ear discharge.   Eyes: Negative for blurred vision, double vision, photophobia, pain, discharge and redness.  Respiratory: Negative for  cough, hemoptysis, sputum production, shortness of breath, wheezing and stridor.   Cardiovascular: Negative for chest pain, palpitations, orthopnea, claudication and leg swelling.  Gastrointestinal: Positive for nausea, vomiting and abdominal pain. Negative for heartburn, constipation, blood in stool and melena.  Genitourinary: Negative for dysuria, urgency, frequency, hematuria.  Musculoskeletal: Negative for myalgias, back pain, joint pain and falls.  Skin: Negative for itching and rash.  Neurological: Negative for dizziness and weakness. Negative for tingling, tremors, sensory change, speech change, focal weakness, loss of consciousness and headaches.  Endo/Heme/Allergies: Negative for environmental allergies and polydipsia. Does not bruise/bleed easily.  Psychiatric/Behavioral: Negative for suicidal ideas. The patient is not nervous/anxious.      Past Medical History  Diagnosis Date  . Blood transfusion 2009    s/p kidney surgery  . Acute meniscal tear, lateral     right leg  . Pain, dental   . Knee effusion, right     joint  . Hx successful VBAC (vaginal birth after cesarean), currently pregnant   . Kidney stone 2009  . Kidney calculus     right staghorn   Past Surgical History  Procedure Laterality Date  . Cesarean section    . Kidney surgery  2009  . Appendectomy    . Nephrolithotomy  12/21/2006    percutaneous nephrolithotomy of right staghorn calculus- carbonate apatite  . Kidney stone surgery  02/20/2007    repeat pcnl for remaining right kidney stone fragments  . Kidney stone surgery  03/19/2007    for futher stone fragment removal   Social History:  reports that she has never smoked. She has never used smokeless tobacco. She reports  that she does not drink alcohol or use illicit drugs.  No Known Allergies  Family History: no history of cancers, no cardiovascular diseases on mother or father side  Prior to Admission medications   Medication Sig Start Date End Date  Taking? Authorizing Provider  NAPROXEN PO Take 2 tablets by mouth every 4 (four) hours as needed (for pain).   Yes Historical Provider, MD   Physical Exam: Filed Vitals:   12/02/12 2045 12/02/12 2100 12/02/12 2115 12/02/12 2126  BP: 93/43 91/43 103/49 103/49  Pulse: 87 86 90   Temp: 99.5 F (37.5 C)     TempSrc: Oral     Resp: 21 24 20 26   Height:      Weight:      SpO2: 99% 99% 98% 98%    Physical Exam  Constitutional: Appears well-developed and well-nourished. In mild distress due to pain. HENT: Normocephalic. External right and left ear normal. Oropharynx is clear and moist.  Eyes: Conjunctivae and EOM are normal. PERRLA, no scleral icterus.  Neck: Normal ROM. Neck supple. No JVD. No tracheal deviation. No thyromegaly.  CVS: RRR, S1/S2 +, no murmurs, no gallops, no carotid bruit.  Pulmonary: Effort and breath sounds normal, no stridor, rhonchi, wheezes, rales.  Abdominal: Soft. BS +,  no distension, tenderness in left upper and lower abdominal quadrant, no rebound or guarding. Left flank area tenderness Musculoskeletal: Normal range of motion. No edema and no tenderness.  Lymphadenopathy: No lymphadenopathy noted, cervical, inguinal. Neuro: Alert. Normal reflexes, muscle tone coordination. No cranial nerve deficit. Skin: Skin is warm and dry. No rash noted. Not diaphoretic. No erythema. No pallor.  Psychiatric: Normal mood and affect. Behavior, judgment, thought content normal.   Labs on Admission:  Basic Metabolic Panel:  Recent Labs Lab 12/02/12 1856 12/02/12 1913  NA 134* 137  K 3.4* 3.3*  CL 100 103  CO2 22  --   GLUCOSE 122* 123*  BUN 17 16  CREATININE 1.04 1.10  CALCIUM 8.5  --    Liver Function Tests:  Recent Labs Lab 12/02/12 1856  AST 17  ALT 16  ALKPHOS 74  BILITOT 0.3  PROT 7.0  ALBUMIN 3.2*   CBC:  Recent Labs Lab 12/02/12 1856 12/02/12 1913  WBC 8.7  --   NEUTROABS 7.9*  --   HGB 12.8 12.9  HCT 37.7 38.0  MCV 95.4  --   PLT 167  --     Radiological Exams on Admission: Dg Chest Port 1 View  (if Code Sepsis Called)  12/02/2012   *RADIOLOGY REPORT*  Clinical Data: Flank pain.  PORTABLE CHEST - 1 VIEW  Comparison: None.  Findings: Cardiomediastinal silhouette is within normal limits. The lungs are free of focal consolidations and pleural effusions. No edema.  IMPRESSION: Negative exam.   Original Report Authenticated By: Norva Pavlov, M.D.   EKG: Normal sinus rhythm, no ST/T wave changes  Code Status: Full Family Communication: Pt at bedside Disposition Plan: Admit for further evaluation  Manson Passey, MD  [facility] Pager [phone]  If 7PM-7AM, please contact night-coverage [email or web address] Password [facility] 12/02/2012, 9:28 PM

## 2012-12-02 NOTE — ED Notes (Signed)
Pt c/o intermittent bilateral lower back pain and blood in her urine x3 days, pt reports new onset of fever and chills last night. Pt reports hx of kidney stone, denies problems with urination, abnormal vaginal odor or d/c

## 2012-12-03 ENCOUNTER — Inpatient Hospital Stay (HOSPITAL_COMMUNITY): Payer: Medicaid Other

## 2012-12-03 ENCOUNTER — Encounter (HOSPITAL_COMMUNITY): Payer: Self-pay | Admitting: Radiology

## 2012-12-03 DIAGNOSIS — N9489 Other specified conditions associated with female genital organs and menstrual cycle: Secondary | ICD-10-CM

## 2012-12-03 DIAGNOSIS — N281 Cyst of kidney, acquired: Secondary | ICD-10-CM

## 2012-12-03 DIAGNOSIS — A4150 Gram-negative sepsis, unspecified: Secondary | ICD-10-CM

## 2012-12-03 DIAGNOSIS — Q619 Cystic kidney disease, unspecified: Secondary | ICD-10-CM

## 2012-12-03 DIAGNOSIS — N949 Unspecified condition associated with female genital organs and menstrual cycle: Secondary | ICD-10-CM

## 2012-12-03 LAB — COMPREHENSIVE METABOLIC PANEL
ALT: 18 U/L (ref 0–35)
Alkaline Phosphatase: 68 U/L (ref 39–117)
BUN: 11 mg/dL (ref 6–23)
CO2: 18 mEq/L — ABNORMAL LOW (ref 19–32)
Calcium: 6.7 mg/dL — ABNORMAL LOW (ref 8.4–10.5)
GFR calc Af Amer: 85 mL/min — ABNORMAL LOW (ref >90)
GFR calc non Af Amer: 73 mL/min — ABNORMAL LOW (ref >90)
Glucose, Bld: 129 mg/dL — ABNORMAL HIGH (ref 70–99)
Sodium: 137 mEq/L (ref 135–145)

## 2012-12-03 LAB — CBC
Hemoglobin: 10.9 g/dL — ABNORMAL LOW (ref 12.0–15.0)
MCHC: 33.9 g/dL (ref 30.0–36.0)
RDW: 13.8 % (ref 11.5–15.5)

## 2012-12-03 MED ORDER — MAGNESIUM SULFATE 40 MG/ML IJ SOLN
2.0000 g | Freq: Once | INTRAMUSCULAR | Status: AC
  Administered 2012-12-03: 2 g via INTRAVENOUS
  Filled 2012-12-03: qty 50

## 2012-12-03 MED ORDER — POTASSIUM CHLORIDE CRYS ER 20 MEQ PO TBCR
20.0000 meq | EXTENDED_RELEASE_TABLET | Freq: Once | ORAL | Status: AC
  Administered 2012-12-03: 20 meq via ORAL
  Filled 2012-12-03: qty 1

## 2012-12-03 MED ORDER — POTASSIUM CHLORIDE 20 MEQ/15ML (10%) PO LIQD
40.0000 meq | Freq: Once | ORAL | Status: DC
  Filled 2012-12-03: qty 30

## 2012-12-03 MED ORDER — PIPERACILLIN-TAZOBACTAM 3.375 G IVPB
3.3750 g | Freq: Three times a day (TID) | INTRAVENOUS | Status: DC
  Administered 2012-12-03 – 2012-12-04 (×4): 3.375 g via INTRAVENOUS
  Filled 2012-12-03 (×6): qty 50

## 2012-12-03 MED ORDER — LEVOFLOXACIN IN D5W 750 MG/150ML IV SOLN
750.0000 mg | INTRAVENOUS | Status: DC
  Administered 2012-12-03: 750 mg via INTRAVENOUS
  Filled 2012-12-03: qty 150

## 2012-12-03 MED ORDER — IOHEXOL 300 MG/ML  SOLN: 100.0000 mL | Freq: Once | INTRAMUSCULAR | Status: AC | PRN

## 2012-12-03 MED ORDER — HYDROCODONE-ACETAMINOPHEN 5-325 MG PO TABS
2.0000 | ORAL_TABLET | ORAL | Status: DC | PRN
  Administered 2012-12-03 – 2012-12-05 (×4): 2 via ORAL
  Filled 2012-12-03 (×4): qty 2

## 2012-12-03 MED ORDER — POTASSIUM CHLORIDE IN NACL 20-0.9 MEQ/L-% IV SOLN
INTRAVENOUS | Status: DC
  Administered 2012-12-03 – 2012-12-04 (×2): via INTRAVENOUS
  Filled 2012-12-03 (×5): qty 1000

## 2012-12-03 MED ORDER — SODIUM CHLORIDE 0.9 % IV BOLUS (SEPSIS)
1000.0000 mL | Freq: Once | INTRAVENOUS | Status: AC
  Administered 2012-12-03: 1000 mL via INTRAVENOUS

## 2012-12-03 MED ORDER — POTASSIUM CHLORIDE CRYS ER 20 MEQ PO TBCR
40.0000 meq | EXTENDED_RELEASE_TABLET | Freq: Once | ORAL | Status: AC
  Administered 2012-12-03: 40 meq via ORAL
  Filled 2012-12-03: qty 2

## 2012-12-03 MED ORDER — ENOXAPARIN SODIUM 40 MG/0.4ML ~~LOC~~ SOLN
40.0000 mg | SUBCUTANEOUS | Status: DC
  Administered 2012-12-03 – 2012-12-04 (×2): 40 mg via SUBCUTANEOUS
  Filled 2012-12-03 (×3): qty 0.4

## 2012-12-03 NOTE — Progress Notes (Signed)
[facility] PROGRESS NOTE  Melinda Spears WUJ:[id number] DOB: 06-09-1976 DOA: 12/02/2012 PCP: No primary provider on file.  Assessment/Plan  Bilateral pyelonephritis with GNR growing in anaerobic bottle -  Escalate to zosyn  -  Repeat blood cultures in AM  Sepsis with persistent hypoTN -  Required boluses of fluids today -  Continue aggressive IVF -  Making urine, no evidence of end-organ damage  Hypokalemia and hypomagnesemia (1.5) -  Aggressive oral and IV repletion of potassium -  Held on magnesium infusion previously due to hypoTN, however, will try an infusion of 2gm tonight -  Repeat BMP with magnesium in AM  5.2 cm left adnexal cystic lesion (has not been discussed with patient) -  Will need outpatient [place] at discharge  Bilobed cystic focus measuring 4.1 cm at the lower lobe of the right kidney.  Community acquired pneumonia suggested on CXR, however, other than fever, patient has not had associated symptoms.   -  Defer atypical antibiotics unless clinically indicated  Normocytic anemia, likely dilutional from aggressive IV fluids -  Trend CBC  Diet:  Regular Access:  PIV IVF:  NS with kcal at 134ml/h Proph:  lovenox  Code Status: full Family Communication: Spoke with patient and her family via interpreter Disposition Plan: Pending stable blood pressure and results of cultures   Consultants:  None  Procedures:  CT scan abdomen and pelvis  Antibiotics:  Ceftriaxone from May 26 to May 27  Zosyn from May 27   HPI/Subjective:  The patient states that she is having fewer fevers and chills. She is feeling much better. She states that she continues to have severe pain on the left flank which is not relieved by one tablet of pain medication. She is eating okay and not having any diarrhea. He has some mild abdominal pain  Objective: Filed Vitals:   12/02/12 2307 12/03/12 0117 12/03/12 0219 12/03/12 0500  BP: 98/67 96/58 101/64 99/63   Pulse: 84 93 97 89  Temp:  98.4 F (36.9 C)  98.3 F (36.8 C)  TempSrc:      Resp:    16  Height:      Weight:      SpO2: 100%  97% 98%    Intake/Output Summary (Last 24 hours) at 12/03/12 0748 Last data filed at 12/03/12 0500  Gross per 24 hour  Intake 2300.83 ml  Output      0 ml  Net 2300.83 ml   Filed Weights   12/02/12 1813 12/02/12 2207  Weight: 80.74 kg (178 lb) 83.87 kg (184 lb 14.4 oz)    Exam:   General:  Overweight Hispanic female,  No acute distress  HEENT:  NCAT, MMM  Cardiovascular:  RRR, nl S1, S2 no mrg, 2+ pulses, warm extremities  Respiratory:  CTAB, no increased WOB  Abdomen:   NABS, soft, nondistended and mildly tender to palpation without rebound or guarding  MSK:   Normal tone and bulk, no LEE, left flank pain, no right flank pain  Neuro:  Grossly intact  Data Reviewed: Basic Metabolic Panel:  Recent Labs Lab 12/02/12 1856 12/02/12 1913 12/02/12 2301 12/03/12 0505  NA 134* 137  --  137  K 3.4* 3.3*  --  2.8*  CL 100 103  --  108  CO2 22  --   --  18*  GLUCOSE 122* 123*  --  129*  BUN 17 16  --  11  CREATININE 1.04 1.10  --  0.99  CALCIUM  8.5  --   --  6.7*  MG  --   --  1.5  --   PHOS  --   --  2.8  --    Liver Function Tests:  Recent Labs Lab 12/02/12 1856 12/03/12 0505  AST 17 16  ALT 16 18  ALKPHOS 74 68  BILITOT 0.3 0.3  PROT 7.0 5.2*  ALBUMIN 3.2* 2.3*   No results found for this basename: LIPASE, AMYLASE,  in the last 168 hours No results found for this basename: AMMONIA,  in the last 168 hours CBC:  Recent Labs Lab 12/02/12 1856 12/02/12 1913 12/03/12 0505  WBC 8.7  --  8.0  NEUTROABS 7.9*  --   --   HGB 12.8 12.9 10.9*  HCT 37.7 38.0 32.2*  MCV 95.4  --  95.8  PLT 167  --  142*   Cardiac Enzymes: No results found for this basename: CKTOTAL, CKMB, CKMBINDEX, TROPONINI,  in the last 168 hours BNP (last 3 results) No results found for this basename: PROBNP,  in the last 8760 hours CBG: No  results found for this basename: GLUCAP,  in the last 168 hours  No results found for this or any previous visit (from the past 240 hour(s)).   Studies: Ct Abdomen Pelvis W Contrast  12/03/2012   *RADIOLOGY REPORT*  Clinical Data: Fever and chills.  Nausea and vomiting.  CT ABDOMEN AND PELVIS WITH CONTRAST  Technique:  Multidetector CT imaging of the abdomen and pelvis was performed following the standard protocol during bolus administration of intravenous contrast.  Contrast:  100 mL of Omnipaque 300 IV contrast  Comparison: Pelvic ultrasound performed 10/23/2011, and CT of the abdomen and pelvis performed 02/24/2007  Findings: Mild right basilar airspace opacification is concerning for pneumonia.  Left basilar atelectasis is seen.  The liver and spleen are unremarkable in appearance.  The gallbladder is within normal limits.  The pancreas and adrenal glands are unremarkable.  Multiple areas of decreased enhancement are noted within both kidneys, concerning for bilateral pyelonephritis.  Diffuse bilateral perinephric stranding is seen, increased from the prior study.  Associated free fluid is noted tracking about Gerota's fascia on the left.  A bilobed cystic focus measuring 4.1 cm at the lower pole of the right kidney is thought to be postoperative in nature, given the patient's prior right-sided nephrostomy tube.  Mild right renal atrophy is noted.  Soft tissue inflammation along the course of the left ureter likely reflects ureteritis.  There is no evidence of hydronephrosis.  No renal or ureteral stones are seen.  No free fluid is identified.  The small bowel is unremarkable in appearance.  The stomach is within normal limits.  No acute vascular abnormalities are seen.  The appendix is not definitely seen; there is no evidence of appendicitis.  The colon is unremarkable in appearance.  The bladder is moderately distended and grossly unremarkable in appearance.  A small urachal remnant is incidentally  seen.  A bicornuate uterus is noted.  A 5.2 cm left adnexal cystic lesion is nonspecific; it could reflect a hydrosalpinx or possibly an ovarian cyst.  Pelvic ultrasound could be considered for further evaluation, when and as deemed clinically appropriate.  The right ovary is unremarkable in appearance.  No inguinal lymphadenopathy is seen.  No acute osseous abnormalities are identified.  IMPRESSION:  1.  Bilateral pyelonephritis noted, more prominent on the left. 2.  Underlying left-sided ureteritis noted. 3.  Right lower lobe pneumonia noted. 4.  Bilobed cystic focus measuring 4.1 cm at the lower pole of the right kidney is thought to be postoperative in nature; mild right renal atrophy noted. 5.  Bicornuate uterus noted. 6.  5.2 cm left adnexal cystic lesion is nonspecific; it could reflect a hydrosalpinx or possibly an ovarian cyst.  Pelvic ultrasound could be considered for further evaluation, when and as deemed clinically appropriate.  These results were called by telephone on 12/03/2012 at 04:35 a.m. to [facility] on [facility] 3W, who verbally acknowledged these results.   Original Report Authenticated By: Tonia Ghent, M.D.   Dg Chest Port 1 View  (if Code Sepsis Called)  12/02/2012   *RADIOLOGY REPORT*  Clinical Data: Flank pain.  PORTABLE CHEST - 1 VIEW  Comparison: None.  Findings: Cardiomediastinal silhouette is within normal limits. The lungs are free of focal consolidations and pleural effusions. No edema.  IMPRESSION: Negative exam.   Original Report Authenticated By: Norva Pavlov, M.D.    Scheduled Meds: . sodium chloride   Intravenous STAT  . levofloxacin (LEVAQUIN) IV  750 mg Intravenous Q24H  . potassium chloride  40 mEq Oral Once  . potassium chloride  40 mEq Oral Once  . potassium chloride  20 mEq Oral Once  . sodium chloride  3 mL Intravenous Q12H   Continuous Infusions: . sodium chloride 100 mL/hr at 12/03/12 1610    Principal Problem:   Flank pain Active Problems:    Pyelonephritis   Sepsis   Hypokalemia    Time spent: 30 min    Minnetta Sandora, [facility]  [facility] Pager [phone]. If 7PM-7AM, please contact night-coverage at [email or web address], password [facility] 12/03/2012, 7:48 AM  LOS: 1 day

## 2012-12-03 NOTE — Progress Notes (Signed)
Utilization review completed. Rawson Minix, RN, BSN. 

## 2012-12-03 NOTE — Progress Notes (Signed)
ANTICOAGULATION CONSULT NOTE - Initial Consult  Pharmacy Consult for Lovenox Indication: VTE prophylaxis  No Known Allergies  Patient Measurements: Height: 5\' 6"  (167.6 cm) Weight: 184 lb 14.4 oz (83.87 kg) IBW/kg (Calculated) : 59.3   Vital Signs: Temp: 99.4 F (37.4 C) (05/27 1618) Temp src: Oral (05/27 1618) BP: 100/49 mmHg (05/27 1618) Pulse Rate: 94 (05/27 1618)  Labs:  Recent Labs  12/02/12 1856 12/02/12 1913 12/03/12 0505  HGB 12.8 12.9 10.9*  HCT 37.7 38.0 32.2*  PLT 167  --  142*  CREATININE 1.04 1.10 0.99    Estimated Creatinine Clearance: 86.5 ml/min (by C-G formula based on Cr of 0.99).   Medical History: Past Medical History  Diagnosis Date  . Blood transfusion 2009    s/p kidney surgery  . Acute meniscal tear, lateral     right leg  . Pain, dental   . Knee effusion, right     joint  . Hx successful VBAC (vaginal birth after cesarean), currently pregnant   . Kidney stone 2009  . Kidney calculus     right staghorn    Medications:  Prescriptions prior to admission  Medication Sig Dispense Refill  . NAPROXEN PO Take 2 tablets by mouth every 4 (four) hours as needed (for pain).       Scheduled:  . magnesium sulfate 1 - 4 g bolus IVPB  2 g Intravenous Once  . piperacillin-tazobactam (ZOSYN)  IV  3.375 g Intravenous Q8H  . sodium chloride  3 mL Intravenous Q12H   Infusions:  . 0.9 % NaCl with KCl 20 mEq / L      Assessment: 37 y.o female with Bilateral pyelonephritis with GNR bacteremia/sepsis. Pharmacy asked to dose lovenox for VTE prophylaxis.  Patient's renal function is normal. Weight =83.9kg , IBW = 59 kg.  SCr 0.99,  Estimated CrCl ~ 86 ml/min.  PLTC 142K,  H/H 10.9/32.2 Normocytic anemia, likely dilutional from aggressive IV fluids. No active bleeding noted.    No adjustment in normal VTE prophylaxis dose of lovenox needed for this patient.   Goal of Therapy:  Prevent VTE;  Appropriate dose for renal function    Plan:  Lovenox  40mg  SQ q24h  Noah Delaine, RPh Clinical Pharmacist Pager: [phone] 12/03/2012,7:43 PM

## 2012-12-03 NOTE — Progress Notes (Signed)
Radiologist called CT abdomen report that was positive for bilateral pyelonephritis and RLL PNA - report called to K. Schorr, NP.

## 2012-12-03 NOTE — Progress Notes (Signed)
ANTIBIOTIC CONSULT NOTE - INITIAL  Pharmacy Consult for Zosyn Indication: anaerobic GNR bacteremia  No Known Allergies  Patient Measurements: Height: 5\' 6"  (167.6 cm) Weight: 184 lb 14.4 oz (83.87 kg) IBW/kg (Calculated) : 59.3   Vital Signs: Temp: 98.9 F (37.2 C) (05/27 0815) Temp src: Oral (05/27 0815) BP: 83/50 mmHg (05/27 1100) Pulse Rate: 83 (05/27 1100)   Recent Labs  12/02/12 1856 12/02/12 1913 12/03/12 0505  WBC 8.7  --  8.0  HGB 12.8 12.9 10.9*  PLT 167  --  142*  CREATININE 1.04 1.10 0.99   Estimated Creatinine Clearance: 86.5 ml/min (by C-G formula based on Cr of 0.99).    Microbiology: Recent Results (from the past 720 hour(s))  CULTURE, BLOOD (ROUTINE X 2)     Status: None   Collection Time    12/02/12  6:58 PM      Result Value Range Status   Specimen Description BLOOD RIGHT HAND   Final   Special Requests BOTTLES DRAWN AEROBIC AND ANAEROBIC [facility]   Final   Culture  Setup Time 12/03/2012 01:05   Final   Culture     Final   Value: GRAM NEGATIVE RODS     Note: Gram Stain Report Called to,Read Back By and Verified With: CHRISTY JOHNSON 01/03/13 1139 BY SMITHERSJ   Report Status PENDING   Incomplete    Medical History: Past Medical History  Diagnosis Date  . Blood transfusion 2009    s/p kidney surgery  . Acute meniscal tear, lateral     right leg  . Pain, dental   . Knee effusion, right     joint  . Hx successful VBAC (vaginal birth after cesarean), currently pregnant   . Kidney stone 2009  . Kidney calculus     right staghorn    Assessment: 37yo female admitted with left flank pain/pyelonephritis being started on Zosyn for GNR bacteremia. Tmax 101.2'F, WBC 8.0, Scr 0.99 (CrCl ~86)  Antibiotics: Zosyn 5/27 >> Vancomycin 5/26 - 5/26 Levaquin 5/27 - 5/27  Cultures: Blood (5/26) - 1/2 GNR  Goal of Therapy:  Clinical improvement Infection erradication  Plan:  1) Zosyn 3.375g IV Q8 hours (4-hr infusion) 2) F/u renal function,  clinical status, culture data, LOT   Benjaman Pott, PharmD, BCPS 12/03/2012   12:11 PM

## 2012-12-04 LAB — PROCALCITONIN: Procalcitonin: 7.79 ng/mL

## 2012-12-04 LAB — CBC
HCT: 32.2 % — ABNORMAL LOW (ref 36.0–46.0)
Hemoglobin: 10.7 g/dL — ABNORMAL LOW (ref 12.0–15.0)
MCH: 32.3 pg (ref 26.0–34.0)
MCHC: 33.2 g/dL (ref 30.0–36.0)

## 2012-12-04 LAB — URINE CULTURE: Colony Count: >=100000

## 2012-12-04 LAB — GLUCOSE, CAPILLARY
Glucose-Capillary: 110 mg/dL — ABNORMAL HIGH (ref 70–99)
Glucose-Capillary: 96 mg/dL (ref 70–99)

## 2012-12-04 LAB — BASIC METABOLIC PANEL
BUN: 7 mg/dL (ref 6–23)
Chloride: 111 mEq/L (ref 96–112)
Glucose, Bld: 116 mg/dL — ABNORMAL HIGH (ref 70–99)
Potassium: 3.9 mEq/L (ref 3.5–5.1)

## 2012-12-04 MED ORDER — SULFAMETHOXAZOLE-TRIMETHOPRIM 400-80 MG PO TABS
1.0000 | ORAL_TABLET | Freq: Two times a day (BID) | ORAL | Status: DC
  Administered 2012-12-04 – 2012-12-05 (×2): 1 via ORAL
  Filled 2012-12-04 (×4): qty 1

## 2012-12-04 NOTE — Progress Notes (Signed)
[facility] PROGRESS NOTE  CHRISETTE MAN Bustos WUJ:[id number] DOB: 01/16/1976 DOA: 12/02/2012 PCP: No primary provider on file.  37 year old Hispanic female admitted 12/02/2012 with pyelonephritis.  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 -Transitioned to Bactrim DS 5/28 -Follow repeat blood cultures  Sepsis with persistent hypoTN -  Required boluses of fluids 5/27-likely is constitutionally no more low blood pressure -We'll slow IV fluid 5/28-50 cc an hour  Hypokalemia and hypomagnesemia (1.5) -  Aggressive oral and IV repletion of potassium -  Held on magnesium infusion previously due to hypoTN, however, will try an infusion of 2gm tonight -  Repeat BMP with magnesium in AM  5.2 cm left adnexal cystic lesion (has not been discussed with patient) -  Will need outpatient [place] at discharge  Bilobed cystic focus measuring 4.1 cm at the lower lobe of the right kidney.  Community acquired pneumonia suggested on CXR, however, other than fever, patient has not had associated symptoms.   -  Defer atypical antibiotics unless clinically indicated  Normocytic anemia, likely dilutional from aggressive IV fluids -  Trend CBC  Diet:  Regular Access:  PIV IVf 50 cc/hour Proph:  lovenox  Code Status: full Family Communication: Spoke with patient and her family via interpreter Disposition Plan: Pending stable blood pressure and results of cultures   Consultants:  None  Procedures:  CT scan abdomen and pelvis  Antibiotics:  Ceftriaxone from May 26 to May 27  Zosyn from May 27   HPI/Subjective:  The patient states she is doing well with no other issues. No nausea no vomiting no chest pain or  shortness of breath. Baby in room along with family  Objective: Filed Vitals:   12/03/12 1618 12/03/12 2100 12/04/12 0500 12/04/12 1358  BP: 100/49 102/61 109/77 103/49  Pulse: 94 88 101 71  Temp: 99.4 F (37.4 C) 98.5 F (36.9 C) 98.4 F (36.9 C) 98 F (36.7 C)  TempSrc: Oral   Oral  Resp:  18 18 14   Height:      Weight:      SpO2: 98% 99% 100% 99%    Intake/Output Summary (Last 24 hours) at 12/04/12 1723 Last data filed at 12/04/12 1500  Gross per 24 hour  Intake    720 ml  Output   2400 ml  Net  -1680 ml   Filed Weights   12/02/12 1813 12/02/12 2207  Weight: 80.74 kg (178 lb) 83.87 kg (184 lb 14.4 oz)    Exam:   General:  Overweight Hispanic female,  No acute distress  HEENT:  NCAT, MMM  Cardiovascular:  RRR, nl S1, S2 no mrg, 2+ pulses, warm extremities  Respiratory:  CTAB, no increased WOB  Abdomen:   NABS, soft, nondistended and mildly tender to palpation without rebound or guarding    Data Reviewed: Basic Metabolic Panel:  Recent Labs Lab 12/02/12 1856 12/02/12 1913 12/02/12 2301 12/03/12 0505 12/04/12 0628  NA 134* 137  --  137 138  K 3.4* 3.3*  --  2.8* 3.9  CL 100 103  --  108 111  CO2 22  --   --  18* 17*  GLUCOSE 122* 123*  --  129* 116*  BUN 17 16  --  11 7  CREATININE 1.04 1.10  --  0.99 1.00  CALCIUM 8.5  --   --  6.7* 7.3*  MG  --   --  1.5  --  2.3  PHOS  --   --  2.8  --   --    Liver Function Tests:  Recent Labs Lab 12/02/12 1856 12/03/12 0505  AST 17 16  ALT 16 18  ALKPHOS 74 68  BILITOT 0.3 0.3  PROT 7.0 5.2*  ALBUMIN 3.2* 2.3*   No results found for this basename: LIPASE, AMYLASE,  in the last 168 hours No results found for this basename: AMMONIA,  in the last 168 hours CBC:  Recent Labs Lab 12/02/12 1856 12/02/12 1913 12/03/12 0505 12/04/12 0628  WBC 8.7  --  8.0 8.6  NEUTROABS 7.9*  --   --   --   HGB 12.8 12.9 10.9* 10.7*  HCT 37.7 38.0 32.2* 32.2*  MCV 95.4  --  95.8 97.3  PLT 167  --  142* 156    Cardiac Enzymes: No results found for this basename: CKTOTAL, CKMB, CKMBINDEX, TROPONINI,  in the last 168 hours BNP (last 3 results) No results found for this basename: PROBNP,  in the last 8760 hours CBG:  Recent Labs Lab 12/03/12 0747 12/04/12 0721 12/04/12 1116 12/04/12 1629  GLUCAP 119* 110* 96 82    Recent Results (from the past 240 hour(s))  CULTURE, BLOOD (ROUTINE X 2)     Status: None   Collection Time    12/02/12  6:30 PM      Result Value Range Status   Specimen Description BLOOD LEFT ARM   Final   Special Requests BOTTLES DRAWN AEROBIC AND ANAEROBIC 5CC EACH   Final   Culture  Setup Time 12/03/2012 01:06   Final   Culture     Final   Value:        BLOOD CULTURE RECEIVED NO GROWTH TO DATE CULTURE WILL BE HELD FOR 5 DAYS BEFORE ISSUING A FINAL NEGATIVE REPORT   Report Status PENDING   Incomplete  CULTURE, BLOOD (ROUTINE X 2)     Status: None   Collection Time    12/02/12  6:58 PM      Result Value Range Status   Specimen Description BLOOD RIGHT HAND   Final   Special Requests BOTTLES DRAWN AEROBIC AND ANAEROBIC [facility] EACH   Final   Culture  Setup Time 12/03/2012 01:05   Final   Culture     Final   Value: ESCHERICHIA COLI     Note: Gram Stain Report Called to,Read Back By and Verified With: CHRISTY JOHNSON 01/03/13 1139 BY SMITHERSJ   Report Status PENDING   Incomplete  URINE CULTURE     Status: None   Collection Time    12/02/12  7:45 PM      Result Value Range Status   Specimen Description URINE, CLEAN CATCH   Final   Special Requests NONE   Final   Culture  Setup Time 12/02/2012 20:43   Final   Colony Count >=100,000 COLONIES/ML   Final   Culture ESCHERICHIA COLI   Final   Report Status 12/04/2012 FINAL   Final   Organism ID, Bacteria ESCHERICHIA COLI   Final     Studies: Ct Abdomen Pelvis W Contrast  12/03/2012   *RADIOLOGY REPORT*  Clinical Data: Fever and chills.  Nausea and vomiting.  CT ABDOMEN AND PELVIS WITH CONTRAST  Technique:  Multidetector  CT imaging of the abdomen and pelvis was performed following the  standard protocol during bolus administration of intravenous contrast.  Contrast:  100 mL of Omnipaque 300 IV contrast  Comparison: Pelvic ultrasound performed 10/23/2011, and CT of the abdomen and pelvis performed 02/24/2007  Findings: Mild right basilar airspace opacification is concerning for pneumonia.  Left basilar atelectasis is seen.  The liver and spleen are unremarkable in appearance.  The gallbladder is within normal limits.  The pancreas and adrenal glands are unremarkable.  Multiple areas of decreased enhancement are noted within both kidneys, concerning for bilateral pyelonephritis.  Diffuse bilateral perinephric stranding is seen, increased from the prior study.  Associated free fluid is noted tracking about Gerota's fascia on the left.  A bilobed cystic focus measuring 4.1 cm at the lower pole of the right kidney is thought to be postoperative in nature, given the patient's prior right-sided nephrostomy tube.  Mild right renal atrophy is noted.  Soft tissue inflammation along the course of the left ureter likely reflects ureteritis.  There is no evidence of hydronephrosis.  No renal or ureteral stones are seen.  No free fluid is identified.  The small bowel is unremarkable in appearance.  The stomach is within normal limits.  No acute vascular abnormalities are seen.  The appendix is not definitely seen; there is no evidence of appendicitis.  The colon is unremarkable in appearance.  The bladder is moderately distended and grossly unremarkable in appearance.  A small urachal remnant is incidentally seen.  A bicornuate uterus is noted.  A 5.2 cm left adnexal cystic lesion is nonspecific; it could reflect a hydrosalpinx or possibly an ovarian cyst.  Pelvic ultrasound could be considered for further evaluation, when and as deemed clinically appropriate.  The right ovary is unremarkable in appearance.  No inguinal lymphadenopathy is seen.   No acute osseous abnormalities are identified.  IMPRESSION:  1.  Bilateral pyelonephritis noted, more prominent on the left. 2.  Underlying left-sided ureteritis noted. 3.  Right lower lobe pneumonia noted. 4.  Bilobed cystic focus measuring 4.1 cm at the lower pole of the right kidney is thought to be postoperative in nature; mild right renal atrophy noted. 5.  Bicornuate uterus noted. 6.  5.2 cm left adnexal cystic lesion is nonspecific; it could reflect a hydrosalpinx or possibly an ovarian cyst.  Pelvic ultrasound could be considered for further evaluation, when and as deemed clinically appropriate.  These results were called by telephone on 12/03/2012 at 04:35 a.m. to [facility] on [facility] 3W, who verbally acknowledged these results.   Original Report Authenticated By: Tonia Ghent, M.D.   Dg Chest Port 1 View  (if Code Sepsis Called)  12/02/2012   *RADIOLOGY REPORT*  Clinical Data: Flank pain.  PORTABLE CHEST - 1 VIEW  Comparison: None.  Findings: Cardiomediastinal silhouette is within normal limits. The lungs are free of focal consolidations and pleural effusions. No edema.  IMPRESSION: Negative exam.   Original Report Authenticated By: Norva Pavlov, M.D.    Scheduled Meds: . enoxaparin (LOVENOX) injection  40 mg Subcutaneous Q24H  . piperacillin-tazobactam (ZOSYN)  IV  3.375 g Intravenous Q8H  . sodium chloride  3 mL Intravenous Q12H   Continuous Infusions: . 0.9 % NaCl with KCl 20 mEq / L 100 mL/hr at 12/04/12 7829    Principal Problem:   Flank pain Active Problems:   Pyelonephritis   Sepsis   Hypokalemia   Gram negative septicemia   Renal cyst   Adnexal cyst    Time spent: 30 min    Pleas Koch, MD  [facility] (P) [phone]  12/04/2012, 5:23 PM  LOS: 2 days

## 2012-12-05 LAB — CBC
HCT: 33 % — ABNORMAL LOW (ref 36.0–46.0)
Hemoglobin: 10.8 g/dL — ABNORMAL LOW (ref 12.0–15.0)
MCV: 97.1 fL (ref 78.0–100.0)
RDW: 14 % (ref 11.5–15.5)
WBC: 6.8 10*3/uL (ref 4.0–10.5)

## 2012-12-05 LAB — CULTURE, BLOOD (ROUTINE X 2)

## 2012-12-05 LAB — BASIC METABOLIC PANEL
CO2: 21 mEq/L (ref 19–32)
Chloride: 109 mEq/L (ref 96–112)
Creatinine, Ser: 0.98 mg/dL (ref 0.50–1.10)
Potassium: 4.4 mEq/L (ref 3.5–5.1)

## 2012-12-05 MED ORDER — SULFAMETHOXAZOLE-TRIMETHOPRIM 400-80 MG PO TABS: 1.0000 | ORAL_TABLET | Freq: Two times a day (BID) | ORAL | Status: DC

## 2012-12-05 NOTE — Care Management Note (Signed)
    Page 1 of 1   12/05/2012     12:11:06 PM   CARE MANAGEMENT NOTE 12/05/2012  Patient:  Melinda Spears, Melinda Spears   Account Number:  [id number]  Date Initiated:  12/05/2012  Documentation initiated by:  GRAVES-BIGELOW,Keshauna Degraffenreid  Subjective/Objective Assessment:   Pt admitted with sepsis. Pt is from home with husband. Plan for d/c today.     Action/Plan:   F/u appointment was made with Family Medicine at [facility] for f/u. No further needs from CM at this time.   Anticipated DC Date:  12/05/2012   Anticipated DC Plan:  HOME/SELF CARE      DC Planning Services  CM consult  [facility]      Choice offered to / List presented to:             Status of service:  Completed, signed off Medicare Important Message given?   (If response is "NO", the following Medicare IM given date fields will be blank) Date Medicare IM given:   Date Additional Medicare IM given:    Discharge Disposition:  HOME/SELF CARE  Per UR Regulation:  Reviewed for med. necessity/level of care/duration of stay  If discussed at Long Length of Stay Meetings, dates discussed:    Comments:

## 2012-12-05 NOTE — Discharge Summary (Signed)
Physician Discharge Summary  ANNELISA RYBACK Bustos WUJ:[id number] DOB: Mar 04, 1976 DOA: 12/02/2012  PCP: No primary provider on file.  Admit date: 12/02/2012 Discharge date: 12/05/2012  Time spent: 30 minutes   Recommendations for Outpatient Follow-up:  1. Needs ultrasound of 5.2 cm left adnexal cystic lesion as an outpatient-patient made aware of this would help of interpreter 2. Continue Bactrim until 12/12/2012 for pyelonephritis  3. Find a physician for outpatient care  Discharge Diagnoses:  Principal Problem:   Flank pain Active Problems:   Pyelonephritis   Sepsis   Hypokalemia   Gram negative septicemia   Renal cyst   Adnexal cyst   Discharge Condition: Stable  Diet recommendation: Heart healthy  Filed Weights   12/02/12 1813 12/02/12 2207 12/05/12 0518  Weight: 80.74 kg (178 lb) 83.87 kg (184 lb 14.4 oz) 87.544 kg (193 lb)    History of present illness:  This 37 year old Hispanic female was admitted 12/02/2012 with sepsis likely secondary to Escherichia coli pyelonephritis. She initially placed on ceftriaxone which had to be escalate to Zosyn given patient had hypotension and was still somewhat tachycardic. Her hypertension was noted to be persistent and this was thought ultimately to be constitutional as She was given IV fluid boluses and this did not really respond to this. She was noted to have an incidental mass on the kidney  Hospital Course:  E.col ipyelonephritis -She'll Rocephin, then transitioned to Zosyn -Transitioned to Bactrim DS 5/28  -Blood culture repeated 5/28 were negative   Sepsis with persistent hypoTN  - Required boluses of fluids 5/27-likely is constitutionally no more low blood pressure  -We'll slow IV fluid 5/28-50 cc an hour  -Ultimately discontinued his blood pressures were in the 130s over 80s  Hypokalemia and hypomagnesemia (1.5)  - Aggressive oral and IV repletion of potassium  - Held on magnesium infusion previously, was ultimately  replaced with some of this - Repeat BMP with magnesium in AM   5.2 cm left adnexal cystic lesion + Bilobed cystic focus measuring 4.1 cm at the lower lobe of the right kidney.  - Will need outpatient [place] at discharge  -Discussed extensively the interpreter that this needs further followup as an outpatient  Community acquired pneumonia suggested on CXR, however, other than fever, patient has not had associated symptoms.  - Defer atypical antibiotics unless clinically indicated   Normocytic anemia, likely dilutional from aggressive IV fluids  - Trend CBC   Procedures:  Chest x-ray 1 view 5/26 = negative exam  CT abdomen 5/27 = bilateral pyelonephritis, underlying left-sided ureteritis, bilobed cystic focus 1 cm lower pole right kidney? Postop, bicornate uterus, 5.2 so we're left adnexal cystic mass which is potentially hydrosalpinx versus ovarian cyst.  Consultations:  None  Discharge Exam: Filed Vitals:   12/04/12 0500 12/04/12 1358 12/04/12 2100 12/05/12 0518  BP: 109/77 103/49 110/67 133/75  Pulse: 101 71 83 86  Temp: 98.4 F (36.9 C) 98 F (36.7 C) 98.7 F (37.1 C) 99.9 F (37.7 C)  TempSrc:  Oral  Oral  Resp: 18 14 18 18   Height:      Weight:    87.544 kg (193 lb)  SpO2: 100% 99% 96% 100%   She doing well no apparent distress General: Alert oriented happy Cardiovascular: S1-S2 no murmur rub or gallop Respiratory: Clinically clear  Discharge Instructions  Follow-up Information   Follow up with [facility] On 12/05/2012.   Contact information:   [facility]) [address] [place]  [address] [phone]      Discharge Orders   Future Orders Complete By Expires     Diet - low sodium heart healthy  As directed     Discharge instructions  As directed     Comments:      Follow up for Ultrasound of adrenal mass Find a doctor for regular care Finish ALL of the antibiotics.  It is important to do this    Increase  activity slowly  As directed         Medication List    TAKE these medications       NAPROXEN PO  Take 2 tablets by mouth every 4 (four) hours as needed (for pain).     sulfamethoxazole-trimethoprim 400-80 MG per tablet  Commonly known as:  BACTRIM,SEPTRA  Take 1 tablet by mouth every 12 (twelve) hours.       No Known Allergies    The results of significant diagnostics from this hospitalization (including imaging, microbiology, ancillary and laboratory) are listed below for reference.    Significant Diagnostic Studies: Ct Abdomen Pelvis W Contrast  12/03/2012   *RADIOLOGY REPORT*  Clinical Data: Fever and chills.  Nausea and vomiting.  CT ABDOMEN AND PELVIS WITH CONTRAST  Technique:  Multidetector CT imaging of the abdomen and pelvis was performed following the standard protocol during bolus administration of intravenous contrast.  Contrast:  100 mL of Omnipaque 300 IV contrast  Comparison: Pelvic ultrasound performed 10/23/2011, and CT of the abdomen and pelvis performed 02/24/2007  Findings: Mild right basilar airspace opacification is concerning for pneumonia.  Left basilar atelectasis is seen.  The liver and spleen are unremarkable in appearance.  The gallbladder is within normal limits.  The pancreas and adrenal glands are unremarkable.  Multiple areas of decreased enhancement are noted within both kidneys, concerning for bilateral pyelonephritis.  Diffuse bilateral perinephric stranding is seen, increased from the prior study.  Associated free fluid is noted tracking about Gerota's fascia on the left.  A bilobed cystic focus measuring 4.1 cm at the lower pole of the right kidney is thought to be postoperative in nature, given the patient's prior right-sided nephrostomy tube.  Mild right renal atrophy is noted.  Soft tissue inflammation along the course of the left ureter likely reflects ureteritis.  There is no evidence of hydronephrosis.  No renal or ureteral stones are seen.  No free  fluid is identified.  The small bowel is unremarkable in appearance.  The stomach is within normal limits.  No acute vascular abnormalities are seen.  The appendix is not definitely seen; there is no evidence of appendicitis.  The colon is unremarkable in appearance.  The bladder is moderately distended and grossly unremarkable in appearance.  A small urachal remnant is incidentally seen.  A bicornuate uterus is noted.  A 5.2 cm left adnexal cystic lesion is nonspecific; it could reflect a hydrosalpinx or possibly an ovarian cyst.  Pelvic ultrasound could be considered for further evaluation, when and as deemed clinically appropriate.  The right ovary is unremarkable in appearance.  No inguinal lymphadenopathy is seen.  No acute osseous abnormalities are identified.  IMPRESSION:  1.  Bilateral pyelonephritis noted, more prominent on the left. 2.  Underlying left-sided ureteritis noted. 3.  Right lower lobe pneumonia noted. 4.  Bilobed cystic focus measuring 4.1 cm at the lower pole of the right kidney is thought to be postoperative in nature; mild right renal atrophy noted. 5.  Bicornuate uterus noted. 6.  5.2 cm left  adnexal cystic lesion is nonspecific; it could reflect a hydrosalpinx or possibly an ovarian cyst.  Pelvic ultrasound could be considered for further evaluation, when and as deemed clinically appropriate.  These results were called by telephone on 12/03/2012 at 04:35 a.m. to [facility] on [facility], who verbally acknowledged these results.   Original Report Authenticated By: Tonia Ghent, M.D.   Dg Chest Port 1 View  (if Code Sepsis Called)  12/02/2012   *RADIOLOGY REPORT*  Clinical Data: Flank pain.  PORTABLE CHEST - 1 VIEW  Comparison: None.  Findings: Cardiomediastinal silhouette is within normal limits. The lungs are free of focal consolidations and pleural effusions. No edema.  IMPRESSION: Negative exam.   Original Report Authenticated By: Norva Pavlov, M.D.    Microbiology: Recent Results  (from the past 240 hour(s))  CULTURE, BLOOD (ROUTINE X 2)     Status: None   Collection Time    12/02/12  6:30 PM      Result Value Range Status   Specimen Description BLOOD LEFT ARM   Final   Special Requests BOTTLES DRAWN AEROBIC AND ANAEROBIC 5CC EACH   Final   Culture  Setup Time 12/03/2012 01:06   Final   Culture     Final   Value:        BLOOD CULTURE RECEIVED NO GROWTH TO DATE CULTURE WILL BE HELD FOR 5 DAYS BEFORE ISSUING A FINAL NEGATIVE REPORT   Report Status PENDING   Incomplete  CULTURE, BLOOD (ROUTINE X 2)     Status: None   Collection Time    12/02/12  6:58 PM      Result Value Range Status   Specimen Description BLOOD RIGHT HAND   Final   Special Requests BOTTLES DRAWN AEROBIC AND ANAEROBIC [facility] EACH   Final   Culture  Setup Time 12/03/2012 01:05   Final   Culture     Final   Value: ESCHERICHIA COLI     Note: Gram Stain Report Called to,Read Back By and Verified With: CHRISTY JOHNSON 01/03/13 1139 BY SMITHERSJ   Report Status 12/05/2012 FINAL   Final   Organism ID, Bacteria ESCHERICHIA COLI   Final  URINE CULTURE     Status: None   Collection Time    12/02/12  7:45 PM      Result Value Range Status   Specimen Description URINE, CLEAN CATCH   Final   Special Requests NONE   Final   Culture  Setup Time 12/02/2012 20:43   Final   Colony Count >=100,000 COLONIES/ML   Final   Culture ESCHERICHIA COLI   Final   Report Status 12/04/2012 FINAL   Final   Organism ID, Bacteria ESCHERICHIA COLI   Final  CULTURE, BLOOD (SINGLE)     Status: None   Collection Time    12/04/12  6:28 AM      Result Value Range Status   Specimen Description BLOOD LEFT ARM   Final   Special Requests BOTTLES DRAWN AEROBIC AND ANAEROBIC 10CC EACH   Final   Culture  Setup Time 12/04/2012 08:41   Final   Culture     Final   Value:        BLOOD CULTURE RECEIVED NO GROWTH TO DATE CULTURE WILL BE HELD FOR 5 DAYS BEFORE ISSUING A FINAL NEGATIVE REPORT   Report Status PENDING   Incomplete      Labs: Basic Metabolic Panel:  Recent Labs Lab 12/02/12 1856 12/02/12 1913 12/02/12 2301 12/03/12 0505 12/04/12 4098  12/05/12 0546  NA 134* 137  --  137 138 138  K 3.4* 3.3*  --  2.8* 3.9 4.4  CL 100 103  --  108 111 109  CO2 22  --   --  18* 17* 21  GLUCOSE 122* 123*  --  129* 116* 110*  BUN 17 16  --  11 7 5*  CREATININE 1.04 1.10  --  0.99 1.00 0.98  CALCIUM 8.5  --   --  6.7* 7.3* 8.3*  MG  --   --  1.5  --  2.3  --   PHOS  --   --  2.8  --   --   --    Liver Function Tests:  Recent Labs Lab 12/02/12 1856 12/03/12 0505  AST 17 16  ALT 16 18  ALKPHOS 74 68  BILITOT 0.3 0.3  PROT 7.0 5.2*  ALBUMIN 3.2* 2.3*   No results found for this basename: LIPASE, AMYLASE,  in the last 168 hours No results found for this basename: AMMONIA,  in the last 168 hours CBC:  Recent Labs Lab 12/02/12 1856 12/02/12 1913 12/03/12 0505 12/04/12 0628 12/05/12 0546  WBC 8.7  --  8.0 8.6 6.8  NEUTROABS 7.9*  --   --   --   --   HGB 12.8 12.9 10.9* 10.7* 10.8*  HCT 37.7 38.0 32.2* 32.2* 33.0*  MCV 95.4  --  95.8 97.3 97.1  PLT 167  --  142* 156 176   Cardiac Enzymes: No results found for this basename: CKTOTAL, CKMB, CKMBINDEX, TROPONINI,  in the last 168 hours BNP: BNP (last 3 results) No results found for this basename: PROBNP,  in the last 8760 hours CBG:  Recent Labs Lab 12/03/12 0747 12/04/12 0721 12/04/12 1116 12/04/12 1629  GLUCAP 119* 110* 96 82       Signed:  Adonus Uselman, JAI-GURMUKH  [facility] 12/05/2012, 12:00 PM

## 2012-12-09 LAB — CULTURE, BLOOD (ROUTINE X 2)

## 2012-12-10 LAB — CULTURE, BLOOD (SINGLE): Culture: NO GROWTH

## 2013-01-08 ENCOUNTER — Inpatient Hospital Stay (HOSPITAL_COMMUNITY)
Admission: EM | Admit: 2013-01-08 | Discharge: 2013-01-12 | DRG: 690 | Disposition: A | Discharge status: Home / Self Care | Payer: Medicaid Other | Attending: Internal Medicine | Admitting: Internal Medicine

## 2013-01-08 ENCOUNTER — Observation Stay (HOSPITAL_COMMUNITY): Payer: Medicaid Other

## 2013-01-08 ENCOUNTER — Encounter (HOSPITAL_COMMUNITY): Payer: Self-pay | Admitting: *Deleted

## 2013-01-08 DIAGNOSIS — Q619 Cystic kidney disease, unspecified: Secondary | ICD-10-CM

## 2013-01-08 DIAGNOSIS — N949 Unspecified condition associated with female genital organs and menstrual cycle: Secondary | ICD-10-CM

## 2013-01-08 DIAGNOSIS — E876 Hypokalemia: Secondary | ICD-10-CM

## 2013-01-08 DIAGNOSIS — A499 Bacterial infection, unspecified: Secondary | ICD-10-CM | POA: Diagnosis present

## 2013-01-08 DIAGNOSIS — Z1612 Extended spectrum beta lactamase (ESBL) resistance: Secondary | ICD-10-CM

## 2013-01-08 DIAGNOSIS — A4150 Gram-negative sepsis, unspecified: Secondary | ICD-10-CM

## 2013-01-08 DIAGNOSIS — N12 Tubulo-interstitial nephritis, not specified as acute or chronic: Secondary | ICD-10-CM

## 2013-01-08 DIAGNOSIS — Z87442 Personal history of urinary calculi: Secondary | ICD-10-CM

## 2013-01-08 DIAGNOSIS — N281 Cyst of kidney, acquired: Secondary | ICD-10-CM

## 2013-01-08 DIAGNOSIS — A419 Sepsis, unspecified organism: Secondary | ICD-10-CM

## 2013-01-08 DIAGNOSIS — N200 Calculus of kidney: Secondary | ICD-10-CM | POA: Diagnosis present

## 2013-01-08 DIAGNOSIS — A498 Other bacterial infections of unspecified site: Secondary | ICD-10-CM | POA: Diagnosis present

## 2013-01-08 DIAGNOSIS — R109 Unspecified abdominal pain: Secondary | ICD-10-CM

## 2013-01-08 DIAGNOSIS — N10 Acute tubulo-interstitial nephritis: Principal | ICD-10-CM | POA: Diagnosis present

## 2013-01-08 LAB — CBC WITH DIFFERENTIAL/PLATELET
Basophils Absolute: 0 10*3/uL (ref 0.0–0.1)
Eosinophils Relative: 0 % (ref 0–5)
HCT: 37.3 % (ref 36.0–46.0)
Hemoglobin: 12.6 g/dL (ref 12.0–15.0)
Lymphocytes Relative: 9 % — ABNORMAL LOW (ref 12–46)
Lymphs Abs: 1 10*3/uL (ref 0.7–4.0)
MCV: 94.9 fL (ref 78.0–100.0)
Monocytes Absolute: 0.6 10*3/uL (ref 0.1–1.0)
Neutro Abs: 9.7 10*3/uL — ABNORMAL HIGH (ref 1.7–7.7)
RBC: 3.93 MIL/uL (ref 3.87–5.11)
RDW: 13.5 % (ref 11.5–15.5)
WBC: 11.3 10*3/uL — ABNORMAL HIGH (ref 4.0–10.5)

## 2013-01-08 LAB — URINALYSIS, ROUTINE W REFLEX MICROSCOPIC
Bilirubin Urine: NEGATIVE
Glucose, UA: NEGATIVE mg/dL
Specific Gravity, Urine: 1.022 (ref 1.005–1.030)
pH: 6 (ref 5.0–8.0)

## 2013-01-08 LAB — URINE MICROSCOPIC-ADD ON

## 2013-01-08 LAB — BASIC METABOLIC PANEL
CO2: 24 mEq/L (ref 19–32)
Calcium: 8.8 mg/dL (ref 8.4–10.5)
Chloride: 100 mEq/L (ref 96–112)
Creatinine, Ser: 0.9 mg/dL (ref 0.50–1.10)
Glucose, Bld: 151 mg/dL — ABNORMAL HIGH (ref 70–99)
Sodium: 135 mEq/L (ref 135–145)

## 2013-01-08 LAB — POCT PREGNANCY, URINE: Preg Test, Ur: NEGATIVE

## 2013-01-08 MED ORDER — ONDANSETRON HCL 4 MG/2ML IJ SOLN: 4.0000 mg | Freq: Four times a day (QID) | INTRAMUSCULAR | Status: DC | PRN

## 2013-01-08 MED ORDER — ACETAMINOPHEN 325 MG PO TABS: 650.0000 mg | ORAL_TABLET | Freq: Four times a day (QID) | ORAL | Status: DC | PRN

## 2013-01-08 MED ORDER — ACETAMINOPHEN 650 MG RE SUPP: 650.0000 mg | Freq: Four times a day (QID) | RECTAL | Status: DC | PRN

## 2013-01-08 MED ORDER — ACETAMINOPHEN 325 MG PO TABS
650.0000 mg | ORAL_TABLET | Freq: Once | ORAL | Status: AC
  Administered 2013-01-08: 650 mg via ORAL
  Filled 2013-01-08: qty 2

## 2013-01-08 MED ORDER — DOCUSATE SODIUM 100 MG PO CAPS
100.0000 mg | ORAL_CAPSULE | Freq: Two times a day (BID) | ORAL | Status: DC
  Administered 2013-01-09 – 2013-01-12 (×7): 100 mg via ORAL
  Filled 2013-01-08 (×9): qty 1

## 2013-01-08 MED ORDER — HEPARIN SODIUM (PORCINE) 5000 UNIT/ML IJ SOLN
5000.0000 [IU] | Freq: Three times a day (TID) | INTRAMUSCULAR | Status: DC
  Administered 2013-01-08 – 2013-01-12 (×11): 5000 [IU] via SUBCUTANEOUS
  Filled 2013-01-08 (×14): qty 1

## 2013-01-08 MED ORDER — MORPHINE SULFATE 2 MG/ML IJ SOLN
1.0000 mg | INTRAMUSCULAR | Status: DC | PRN
  Administered 2013-01-08: 1 mg via INTRAVENOUS
  Filled 2013-01-08: qty 1

## 2013-01-08 MED ORDER — HYDROCODONE-ACETAMINOPHEN 5-325 MG PO TABS
1.0000 | ORAL_TABLET | ORAL | Status: DC | PRN
  Administered 2013-01-08 – 2013-01-10 (×5): 2 via ORAL
  Filled 2013-01-08 (×5): qty 2

## 2013-01-08 MED ORDER — SULFAMETHOXAZOLE-TRIMETHOPRIM 800-160 MG PO TABS: 1.0000 | ORAL_TABLET | Freq: Two times a day (BID) | ORAL | Status: DC

## 2013-01-08 MED ORDER — HYDROMORPHONE HCL PF 1 MG/ML IJ SOLN: 1.0000 mg | INTRAMUSCULAR | Status: DC | PRN

## 2013-01-08 MED ORDER — ONDANSETRON HCL 4 MG/2ML IJ SOLN: 4.0000 mg | Freq: Three times a day (TID) | INTRAMUSCULAR | Status: DC | PRN

## 2013-01-08 MED ORDER — HYDROCODONE-ACETAMINOPHEN 5-325 MG PO TABS: 1.0000 | ORAL_TABLET | Freq: Four times a day (QID) | ORAL | Status: DC | PRN

## 2013-01-08 MED ORDER — SODIUM CHLORIDE 0.9 % IV BOLUS (SEPSIS)
1000.0000 mL | Freq: Once | INTRAVENOUS | Status: AC
  Administered 2013-01-08: 1000 mL via INTRAVENOUS

## 2013-01-08 MED ORDER — KETOROLAC TROMETHAMINE 30 MG/ML IJ SOLN: 30.0000 mg | Freq: Once | INTRAMUSCULAR | Status: DC

## 2013-01-08 MED ORDER — HYDROCODONE-ACETAMINOPHEN 5-325 MG PO TABS
1.0000 | ORAL_TABLET | Freq: Once | ORAL | Status: AC
  Administered 2013-01-08: 1 via ORAL
  Filled 2013-01-08: qty 1

## 2013-01-08 MED ORDER — ONDANSETRON HCL 4 MG PO TABS: 4.0000 mg | ORAL_TABLET | Freq: Four times a day (QID) | ORAL | Status: DC | PRN

## 2013-01-08 MED ORDER — HYDROMORPHONE HCL PF 1 MG/ML IJ SOLN
1.0000 mg | Freq: Once | INTRAMUSCULAR | Status: AC
  Administered 2013-01-08: 1 mg via INTRAVENOUS
  Filled 2013-01-08: qty 1

## 2013-01-08 MED ORDER — DEXTROSE 5 % IV SOLN
1.0000 g | INTRAVENOUS | Status: DC
  Administered 2013-01-08 – 2013-01-09 (×2): 1 g via INTRAVENOUS
  Filled 2013-01-08 (×3): qty 10

## 2013-01-08 MED ORDER — SODIUM CHLORIDE 0.9 % IV SOLN
INTRAVENOUS | Status: DC
  Administered 2013-01-08 – 2013-01-10 (×3): via INTRAVENOUS

## 2013-01-08 NOTE — ED Provider Notes (Signed)
History    CSN: [id number] Arrival date & time 01/08/13  1242  First MD Initiated Contact with Patient 01/08/13 1345     Chief Complaint  Patient presents with  . Back Pain   (Consider location/radiation/quality/duration/timing/severity/associated sxs/prior Treatment) HPI Comments: 37 y.o. Female wth PMHx of kidney stones and pyelonephritis presents today complaining of gradual onset bilateral lower back pain over the last two days. Pt states at times L>R. Today it is R>L. Pt states pain increased to 10/10 last night. Described as sharp, intermittent, non radiating. Pt did take Tylenol with some relief. Pt states pain is 6/10 here in ED. Admits dysuria. Denies abdominal/pelvic pain, fever, chills, nausea, vomiting, hematuria, diarrhea.   Patient is a 37 y.o. female presenting with back pain.  Back Pain Associated symptoms: dysuria   Associated symptoms: no abdominal pain, no chest pain, no fever, no headaches, no numbness, no pelvic pain and no weakness    Past Medical History  Diagnosis Date  . Blood transfusion 2009    s/p kidney surgery  . Acute meniscal tear, lateral     right leg  . Pain, dental   . Knee effusion, right     joint  . Hx successful VBAC (vaginal birth after cesarean), currently pregnant   . Kidney stone 2009  . Kidney calculus     right staghorn   Past Surgical History  Procedure Laterality Date  . Cesarean section    . Kidney surgery  2009  . Appendectomy    . Nephrolithotomy  12/21/2006    percutaneous nephrolithotomy of right staghorn calculus- carbonate apatite  . Kidney stone surgery  02/20/2007    repeat pcnl for remaining right kidney stone fragments  . Kidney stone surgery  03/19/2007    for futher stone fragment removal   Family History  Problem Relation Age of Onset  . Diabetes Father   . Coronary artery disease Father 74  . Other Neg Hx    History  Substance Use Topics  . Smoking status: Never Smoker   . Smokeless tobacco: Never Used   . Alcohol Use: No   OB History   Grav Para Term Preterm Abortions TAB SAB Ect Mult Living   4 3 2 1 1  0 1 0 0 3     Review of Systems  Constitutional: Negative for fever, chills and diaphoresis.  HENT: Negative for neck pain and neck stiffness.   Eyes: Negative for visual disturbance.  Respiratory: Negative for apnea, chest tightness and shortness of breath.   Cardiovascular: Negative for chest pain and palpitations.  Gastrointestinal: Negative for nausea, vomiting, abdominal pain, diarrhea and constipation.  Genitourinary: Positive for dysuria and flank pain. Negative for hematuria and pelvic pain.       Bilateral flank pain, burning with urination  Musculoskeletal: Positive for back pain. Negative for gait problem.  Skin: Negative for rash.  Neurological: Negative for dizziness, weakness, light-headedness, numbness and headaches.    Allergies  Review of patient's allergies indicates no known allergies.  Home Medications   Current Outpatient Rx  Name  Route  Sig  Dispense  Refill  . NAPROXEN PO   Oral   Take 2 tablets by mouth every 4 (four) hours as needed (pain).           BP 112/62  Pulse 62  Temp(Src) 98.4 F (36.9 C) (Oral)  Resp 16  SpO2 100% Physical Exam  Nursing note and vitals reviewed. Constitutional: She is oriented to person, place, and time.  She appears well-developed and well-nourished. No distress.  HENT:  Head: Normocephalic and atraumatic.  Eyes: Conjunctivae and EOM are normal.  Neck: Normal range of motion. Neck supple.  No meningeal signs  Cardiovascular: Normal rate, regular rhythm and normal heart sounds.  Exam reveals no gallop and no friction rub.   No murmur heard. Pulmonary/Chest: Effort normal and breath sounds normal. No respiratory distress. She has no wheezes. She has no rales. She exhibits no tenderness.  Abdominal: Soft. Bowel sounds are normal. She exhibits no distension. There is no tenderness. There is no rebound and no  guarding.  Musculoskeletal: Normal range of motion. She exhibits no edema and no tenderness.  Neurological: She is alert and oriented to person, place, and time. No cranial nerve deficit.  Skin: Skin is warm and dry. She is not diaphoretic. No erythema.  Psychiatric: She has a normal mood and affect.    ED Course  Procedures (including critical care time) Labs Reviewed  URINALYSIS, ROUTINE W REFLEX MICROSCOPIC - Abnormal; Notable for the following:    Color, Urine AMBER (*)    APPearance CLOUDY (*)    Hgb urine dipstick TRACE (*)    Ketones, ur 40 (*)    Protein, ur 30 (*)    Nitrite POSITIVE (*)    Leukocytes, UA LARGE (*)    All other components within normal limits  URINE MICROSCOPIC-ADD ON - Abnormal; Notable for the following:    Squamous Epithelial / LPF FEW (*)    Bacteria, UA MANY (*)    All other components within normal limits  CBC WITH DIFFERENTIAL - Abnormal; Notable for the following:    WBC 11.3 (*)    Neutrophils Relative % 86 (*)    Neutro Abs 9.7 (*)    Lymphocytes Relative 9 (*)    All other components within normal limits  BASIC METABOLIC PANEL - Abnormal; Notable for the following:    Glucose, Bld 151 (*)    GFR calc non Af Amer 82 (*)    All other components within normal limits  URINE CULTURE  POCT PREGNANCY, URINE   No results found. 1. Urinary tract infection     MDM  Pt is afebrile, mild bilateral CVA tenderness, normotensive, and denies N/V. Pt to be dc home with antibiotics and instructions to follow up with PCP if symptoms persist. Review of records show last bacteria was sensitive to Bactrim. Urology referral and PCP resource list provided. Discussed case with Dr. Rubin Payor who is in agreement with discharge.   4:03 PM As I was getting ready to discuss the pt discharge instruction, pt was in rigors, tachypnic, tachycardic, febrile at 100.8, and pain increased to 10/10 even after norco. Given pt kidney hx, and recent sepsis, Dr. Rubin Payor agrees  to admit pt for obs. Consult to unassigned appreciated with Dr. Joseph Art who will admit the pt. Will get blood cx and CT scan per discussion with Dr. Joseph Art.   Glade Nurse, PA-C 01/08/13 1535  [address], [place] 01/08/13 [phone]

## 2013-01-08 NOTE — H&P (Signed)
[facility] History and Physical  Melinda Spears ZOX:[id number] DOB: 1975-11-13 DOA: 01/08/2013  Referring physician: Reola Calkins, PA.  PCP: No primary provider on file.  Specialists: none  Chief Complaint: back pain, dysuria.   HPI: Melinda Spears is a 37 y.o. female with PMH significant for kidney stone, last admission 5-26 for sepsis, pyelonephritis, who presents to ED complaining of back pain, dysuria, chills that started 3 days prior to admission. She relates that she finished antibiotics prescribe last admission. She denies abdominal pain, nausea, vomiting, diarrhea, chest pain.   Review of Systems: negative except as per HPI.   Past Medical History  Diagnosis Date  . Blood transfusion 2009    s/p kidney surgery  . Acute meniscal tear, lateral     right leg  . Pain, dental   . Knee effusion, right     joint  . Hx successful VBAC (vaginal birth after cesarean), currently pregnant   . Kidney stone 2009  . Kidney calculus     right staghorn   Past Surgical History  Procedure Laterality Date  . Cesarean section    . Kidney surgery  2009  . Appendectomy    . Nephrolithotomy  12/21/2006    percutaneous nephrolithotomy of right staghorn calculus- carbonate apatite  . Kidney stone surgery  02/20/2007    repeat pcnl for remaining right kidney stone fragments  . Kidney stone surgery  03/19/2007    for futher stone fragment removal   Social History:  reports that she has never smoked. She has never used smokeless tobacco. She reports that she does not drink alcohol or use illicit drugs.   No Known Allergies  Family History  Problem Relation Age of Onset  . Diabetes Father   . Coronary artery disease Father 9  . Other Neg Hx      Prior to Admission medications   Medication Sig Start Date End Date Taking? Authorizing Provider  NAPROXEN PO Take 2 tablets by mouth every 4 (four) hours as needed (pain).    Yes Historical Provider, MD  HYDROcodone-acetaminophen  (NORCO/VICODIN) 5-325 MG per tablet Take 1 tablet by mouth every 6 (six) hours as needed for pain. 01/08/13   Glade Nurse, PA-C  sulfamethoxazole-trimethoprim (SEPTRA DS) 800-160 MG per tablet Take 1 tablet by mouth every 12 (twelve) hours. 01/08/13   [facility] Nurse, PA-C   Physical Exam: Filed Vitals:   01/08/13 1528 01/08/13 1715 01/08/13 1803 01/08/13 1933  BP: 106/79  104/59 93/58  Pulse: 105  87 76  Temp: 100.8 F (38.2 C) 99.4 F (37.4 C) 100.3 F (37.9 C) 99.2 F (37.3 C)  TempSrc:   Oral Oral  Resp: 22  16 16   Height:    5\' 4"  (1.626 m)  Weight:    78 kg (171 lb 15.3 oz)  SpO2: 100%  98% 98%   General Appearance:    Alert, cooperative, no distress, appears stated age  Head:    Normocephalic, without obvious abnormality, atraumatic  Eyes:    PERRL, conjunctiva/corneas clear, EOM's intact,      Ears:    Normal TM's and external ear canals, both ears  Nose:   Nares normal, septum midline, mucosa normal, no drainage    or sinus tenderness  Throat:   Lips, mucosa, and tongue normal; teeth and gums normal  Neck:   Supple, symmetrical, trachea midline, no adenopathy;    thyroid:  no enlargement/tenderness/nodules; no carotid   bruit or JVD  Back:  Symmetric, no curvature, ROM normal, no CVA tenderness  Lungs:     Clear to auscultation bilaterally, respirations unlabored  Chest Wall:    No tenderness or deformity   Heart:    Regular rate and rhythm, S1 and S2 normal, no murmur, rub   or gallop     Abdomen:     Soft, non-tender, bowel sounds active all four quadrants,    no masses, no organomegaly, CVA tenderness worse on left side.         Extremities:   Extremities normal, atraumatic, no cyanosis or edema  Pulses:   2+ and symmetric all extremities  Skin:   Skin color, texture, turgor normal, no rashes or lesions  Lymph nodes:   Cervical, supraclavicular, and axillary nodes normal  Neurologic:   CNII-XII intact, normal strength, sensation and reflexes    throughout       Labs on Admission:  Basic Metabolic Panel:  Recent Labs Lab 01/08/13 1409  NA 135  K 3.6  CL 100  CO2 24  GLUCOSE 151*  BUN 7  CREATININE 0.90  CALCIUM 8.8   Liver Function Tests: No results found for this basename: AST, ALT, ALKPHOS, BILITOT, PROT, ALBUMIN,  in the last 168 hours No results found for this basename: LIPASE, AMYLASE,  in the last 168 hours No results found for this basename: AMMONIA,  in the last 168 hours CBC:  Recent Labs Lab 01/08/13 1409  WBC 11.3*  NEUTROABS 9.7*  HGB 12.6  HCT 37.3  MCV 94.9  PLT 185   Cardiac Enzymes: No results found for this basename: CKTOTAL, CKMB, CKMBINDEX, TROPONINI,  in the last 168 hours  BNP (last 3 results) No results found for this basename: PROBNP,  in the last 8760 hours CBG: No results found for this basename: GLUCAP,  in the last 168 hours  Radiological Exams on Admission: Ct Abdomen Pelvis Wo Contrast  01/08/2013   *RADIOLOGY REPORT*  Clinical Data: Right flank pain  CT ABDOMEN AND PELVIS WITHOUT CONTRAST  Technique:  Multidetector CT imaging of the abdomen and pelvis was performed following the standard protocol without intravenous contrast.  Comparison: None.  Findings: Subsegmental atelectasis at the right lung base.  No ureteral calculus.  No hydronephrosis.  Small calculi are present and scattered in the collecting system of the right kidney. The lower pole of the right kidney has been replaced by two complex cystic areas.  Centrally, there characterized as simple fluid but there are rim calcifications.  No obvious solid elements.   There is a new ill-defined hypodensity in the medial segment of the left lobe of the liver adjacent to the falciform ligament anteriorly on image 27. This is favored represent interval development of focal fatty infiltration.  The borders are geographic.  There is no mass effect upon adjacent tissues.  Spleen, pancreas or gallbladder, adrenal glands are within normal limits.   Bladder, uterus, and adnexa are within normal limits.  The appendix is not visualized.  No free fluid.  IMPRESSION: No evidence of ureteral calculus or ureteral obstruction.  Right nephrolithiasis.  The lower pole of the right kidney is atrophic and is now occupied by two complex cysts as described above.  These are favored to represent benign complex cysts.  As clinically indicated, MRI can be performed to further characterize.   Original Report Authenticated By: Jolaine Click, M.D.     Assessment/Plan Active Problems:   * No active hospital problems. *   1-Pyelonephritis: Patient presents with Dysuria,  Flank pain. UA with too numerous to count WBC, positive nitrates. IV fluids. I will start Ceftriaxone. 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 Will also check HIV. CT show nephrolithiasis. Check lactic acid.   2-Nephrolithiasis: IV fluids. Pain medication.  3-Renal Cyst : need follow up.    Code Status: Full Code.  Family Communication:Care discussed with patient.  Disposition Plan: expect 3 days inpatient.   Time spent: 75 minutes  Raneisha Bress [facility] Pager [phone]  If 7PM-7AM, please contact night-coverage [email or web address] Password [email or web address] 01/08/2013, 8:40 PM

## 2013-01-08 NOTE — ED Notes (Signed)
Orders are in place now

## 2013-01-08 NOTE — ED Notes (Signed)
Pt reports bilateral lower back pain x 2 days, denies injury or heavy lifting. Reports pain with urination.

## 2013-01-08 NOTE — ED Notes (Signed)
Family at bedside. 

## 2013-01-08 NOTE — ED Notes (Signed)
Pt c/o being cold chills.  vicodin given vitals taken.  Family awake now

## 2013-01-08 NOTE — ED Notes (Signed)
Given to the 5000 rn but we have no orders for this pt

## 2013-01-08 NOTE — ED Notes (Signed)
Xray here to take to the c-t scanner

## 2013-01-08 NOTE — ED Notes (Signed)
Pt up to the br.  She is more comfortable temp down even before the tylenol was given

## 2013-01-08 NOTE — ED Notes (Signed)
Pt asleep also family member at bedside asleep also

## 2013-01-08 NOTE — Progress Notes (Signed)
Pt arrived to floor via stretcher from ed. Pt placed in bed, oriented to room,and  safety video viewed. Pt in no apparent distress at this time. Vss. rn will page md for admission orders.

## 2013-01-09 LAB — HEMOGLOBIN A1C
Hgb A1c MFr Bld: 5.4 % (ref <5.7)
Mean Plasma Glucose: 108 mg/dL (ref <117)

## 2013-01-09 LAB — CBC
Hemoglobin: 11.6 g/dL — ABNORMAL LOW (ref 12.0–15.0)
Platelets: 171 10*3/uL (ref 150–400)
RBC: 3.67 MIL/uL — ABNORMAL LOW (ref 3.87–5.11)
WBC: 9.6 10*3/uL (ref 4.0–10.5)

## 2013-01-09 LAB — BASIC METABOLIC PANEL
BUN: 5 mg/dL — ABNORMAL LOW (ref 6–23)
Calcium: 7.9 mg/dL — ABNORMAL LOW (ref 8.4–10.5)
Chloride: 103 mEq/L (ref 96–112)
Creatinine, Ser: 0.66 mg/dL (ref 0.50–1.10)
GFR calc Af Amer: >90 mL/min (ref >90)
GFR calc non Af Amer: >90 mL/min (ref >90)

## 2013-01-09 LAB — HIV ANTIBODY (ROUTINE TESTING W REFLEX): HIV: NONREACTIVE

## 2013-01-09 LAB — LACTIC ACID, PLASMA: Lactic Acid, Venous: 0.9 mmol/L (ref 0.5–2.2)

## 2013-01-09 MED ORDER — POTASSIUM CHLORIDE CRYS ER 20 MEQ PO TBCR
40.0000 meq | EXTENDED_RELEASE_TABLET | Freq: Once | ORAL | Status: AC
  Administered 2013-01-09: 40 meq via ORAL
  Filled 2013-01-09: qty 2

## 2013-01-09 NOTE — ED Provider Notes (Signed)
Medical screening examination/treatment/procedure(s) were performed by non-physician practitioner and as supervising physician I was immediately available for consultation/collaboration.  Sona Nations R. Malissie Musgrave, MD 01/09/13 1100 

## 2013-01-09 NOTE — Progress Notes (Signed)
[facility] PROGRESS NOTE  Melinda Spears MWU:[id number] DOB: February 05, 1976 DOA: 01/08/2013 PCP: No primary provider on file.  Assessment/Plan: Pyelonephritis:  - UA suggestive of UTI, pt also has CVAT - Blood and Urine cultures pending - NGTD - Continue Ceftriaxone (started 7/2).  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 Not sure if the renal cysts are contributing to recurrence of her symptoms. No hydronephrosis seen on the CT scan of the abdomen - No leukocytosis - continue to follow CBC  Nephrolithiasis:  - Seen in abdominal CT in the R kidney- however her pain is mostly in the left flank area. - IV fluids - Control pain  Renal Cyst :  - Will need outpatient follow up.   Code Status: Full Family Communication: Husband at bedside Disposition Plan: Inpatient   Consultants:  None at this time  Procedures:  None at this time  Antibiotics:  Ceftriaxone 1 g IV 7/02 >>   HPI/Subjective: 37 yo Hispanic female with history of pyelonephritis (D/C 12/05/2012) presents with new onset flank pain and dysuria x 5 days. She states she completed the antibiotics as directed after D/C. She denies fever at home but states she has had fever and chills since admission. She denies N/V/D, abdominal pain, chest pain, SOB. She has a history of nephrolithiasis.  **History obtained with assistance of interpretor   Objective: Filed Vitals:   01/08/13 1803 01/08/13 1933 01/08/13 2119 01/09/13 0500  BP: 104/59 93/58 120/81 104/66  Pulse: 87 76 75 80  Temp: 100.3 F (37.9 C) 99.2 F (37.3 C) 100.3 F (37.9 C) 100.3 F (37.9 C)  TempSrc: Oral Oral Oral Oral  Resp: 16 16 16 16   Height:  5\' 4"  (1.626 m)    Weight:  78 kg (171 lb 15.3 oz)  78.1 kg (172 lb 2.9 oz)  SpO2: 98% 98% 100% 98%    Intake/Output Summary (Last 24 hours) at 01/09/13 1258 Last data filed at 01/09/13 7253  Gross per 24 hour  Intake 1068.75 ml  Output    375 ml  Net 693.75 ml   Filed Weights   01/08/13 1933 01/09/13 0500  Weight: 78 kg (171 lb 15.3 oz) 78.1 kg (172 lb 2.9 oz)    Exam:   General:  A & O x3, NAD, sitting on bedside  Cardiovascular: RRR, no m/g/r, no peripheral edema   Respiratory: CTA bilaterally, no wheezes, rales, or rhonchi   Abdomen: BS+, soft, NT, ND, no organomegaly, +CVA tenderness (Left). No midline/lumbar spine area tenderness.  Musculoskeletal: moving all extremities   Data Reviewed: Basic Metabolic Panel:  Recent Labs Lab 01/08/13 1409 01/09/13 0835  NA 135 133*  K 3.6 3.2*  CL 100 103  CO2 24 20  GLUCOSE 151* 101*  BUN 7 5*  CREATININE 0.90 0.66  CALCIUM 8.8 7.9*   CBC:  Recent Labs Lab 01/08/13 1409 01/09/13 0835  WBC 11.3* 9.6  NEUTROABS 9.7*  --   HGB 12.6 11.6*  HCT 37.3 34.6*  MCV 94.9 94.3  PLT 185 171     Recent Results (from the past 240 hour(s))  CULTURE, BLOOD (ROUTINE X 2)     Status: None   Collection Time    01/08/13  4:35 PM      Result Value Range Status   Specimen Description BLOOD ARM LEFT   Final   Special Requests BOTTLES DRAWN AEROBIC ONLY 3CC   Final   Culture  Setup Time 01/08/2013 23:10   Final   Culture  Final   Value:        BLOOD CULTURE RECEIVED NO GROWTH TO DATE CULTURE WILL BE HELD FOR 5 DAYS BEFORE ISSUING A FINAL NEGATIVE REPORT   Report Status PENDING   Incomplete  CULTURE, BLOOD (ROUTINE X 2)     Status: None   Collection Time    01/08/13  4:37 PM      Result Value Range Status   Specimen Description BLOOD HAND LEFT   Final   Special Requests BOTTLES DRAWN AEROBIC ONLY 3CC   Final   Culture  Setup Time 01/08/2013 23:11   Final   Culture     Final   Value:        BLOOD CULTURE RECEIVED NO GROWTH TO DATE CULTURE WILL BE HELD FOR 5 DAYS BEFORE ISSUING A FINAL NEGATIVE REPORT   Report Status PENDING   Incomplete     Studies: Ct Abdomen Pelvis Wo Contrast  01/08/2013   *RADIOLOGY REPORT*  Clinical Data: Right flank pain  CT ABDOMEN AND PELVIS WITHOUT CONTRAST  Technique:   Multidetector CT imaging of the abdomen and pelvis was performed following the standard protocol without intravenous contrast.  Comparison: None.  Findings: Subsegmental atelectasis at the right lung base.  No ureteral calculus.  No hydronephrosis.  Small calculi are present and scattered in the collecting system of the right kidney. The lower pole of the right kidney has been replaced by two complex cystic areas.  Centrally, there characterized as simple fluid but there are rim calcifications.  No obvious solid elements.   There is a new ill-defined hypodensity in the medial segment of the left lobe of the liver adjacent to the falciform ligament anteriorly on image 27. This is favored represent interval development of focal fatty infiltration.  The borders are geographic.  There is no mass effect upon adjacent tissues.  Spleen, pancreas or gallbladder, adrenal glands are within normal limits.  Bladder, uterus, and adnexa are within normal limits.  The appendix is not visualized.  No free fluid.  IMPRESSION: No evidence of ureteral calculus or ureteral obstruction.  Right nephrolithiasis.  The lower pole of the right kidney is atrophic and is now occupied by two complex cysts as described above.  These are favored to represent benign complex cysts.  As clinically indicated, MRI can be performed to further characterize.   Original Report Authenticated By: Jolaine Click, M.D.    Scheduled Meds: . cefTRIAXone (ROCEPHIN)  IV  1 g Intravenous Q24H  . docusate sodium  100 mg Oral BID  . heparin  5,000 Units Subcutaneous Q8H   Continuous Infusions: . sodium chloride 125 mL/hr at 01/09/13 1610    Active Problems:   * No active hospital problems. Ralene Muskrat PA-S [facility] 01/09/2013, 12:58 PM  LOS: 1 day   Attending - Patient seen and examined, agree with the above assessment and plan. This is his second admission for pyelonephritis, she was previously admitted for pyelonephritis and  bacteremia with Escherichia coli. She completed a ten-day course of Bactrim a few days ago and now is back with fever, left flank pain and a urinalysis that is consistent with a UTI. Continue with IV Rocephin, await urine and blood cultures-we'll decide if further imaging or workup is needed depending upon culture results.  Windell Norfolk MD

## 2013-01-09 NOTE — Progress Notes (Signed)
Utilization review completed. Danasia Baker, RN, BSN. 

## 2013-01-09 NOTE — Care Management Note (Unsigned)
    Page 1 of 1   01/09/2013     5:53:40 PM   CARE MANAGEMENT NOTE 01/09/2013  Patient:  MCKINLEIGH, SCHUCHART A   Account Number:  [id number]  Date Initiated:  01/09/2013  Documentation initiated by:  Letha Cape  Subjective/Objective Assessment:   dx pyelo  admit= lives with family. pt has orange card and medicaid.     Action/Plan:   Anticipated DC Date:  01/10/2013   Anticipated DC Plan:  HOME/SELF CARE      DC Planning Services  CM consult      Choice offered to / List presented to:             Status of service:  In process, will continue to follow Medicare Important Message given?   (If response is "NO", the following Medicare IM given date fields will be blank) Date Medicare IM given:   Date Additional Medicare IM given:    Discharge Disposition:    Per UR Regulation:  Reviewed for med. necessity/level of care/duration of stay  If discussed at Long Length of Stay Meetings, dates discussed:    Comments:  01/09/13 17:51 Letha Cape RN, BSN [phone] patient lives with family, patient is indep. Patient has medicaid and an orange card, she has no problem with getting meds.  Patient has tranportation at dc.

## 2013-01-10 DIAGNOSIS — Z1619 Resistance to other specified beta lactam antibiotics: Secondary | ICD-10-CM

## 2013-01-10 DIAGNOSIS — N10 Acute tubulo-interstitial nephritis: Principal | ICD-10-CM

## 2013-01-10 DIAGNOSIS — Z1612 Extended spectrum beta lactamase (ESBL) resistance: Secondary | ICD-10-CM | POA: Diagnosis present

## 2013-01-10 DIAGNOSIS — B9689 Other specified bacterial agents as the cause of diseases classified elsewhere: Secondary | ICD-10-CM

## 2013-01-10 LAB — BASIC METABOLIC PANEL
Chloride: 107 mEq/L (ref 96–112)
Creatinine, Ser: 0.63 mg/dL (ref 0.50–1.10)
GFR calc Af Amer: >90 mL/min (ref >90)
Potassium: 3.6 mEq/L (ref 3.5–5.1)
Sodium: 138 mEq/L (ref 135–145)

## 2013-01-10 LAB — CBC
Platelets: 180 10*3/uL (ref 150–400)
RBC: 3.31 MIL/uL — ABNORMAL LOW (ref 3.87–5.11)
RDW: 14 % (ref 11.5–15.5)
WBC: 6 10*3/uL (ref 4.0–10.5)

## 2013-01-10 LAB — URINE CULTURE

## 2013-01-10 MED ORDER — SODIUM CHLORIDE 0.9 % IV SOLN
1.0000 g | INTRAVENOUS | Status: DC
  Administered 2013-01-10 – 2013-01-12 (×3): 1 g via INTRAVENOUS
  Filled 2013-01-10 (×3): qty 1

## 2013-01-10 NOTE — Progress Notes (Signed)
[facility] PROGRESS NOTE  Melinda Spears ZOX:[id number] DOB: [birth year] DOA: 01/08/2013 PCP: No primary provider on file.  Assessment/Plan: ESBL left sided Pyelonephritis:  - UA suggestive of UTI, pt also has CVAT  - Blood cultures pending - NGTD  - Urine culture (7/2) - E.coli with ESBL, Urine culture (7/3) - pending  - D/C Ceftriaxone - urine culture shows resistance. Will start on Ertapenem (7/4). Spoke with Dr. Algis Liming - Recurrent infection - Hb-A1c is normal, and HIV is nonreactive. Not sure if the renal cysts are contributing to recurrence of her symptoms. No hydronephrosis seen on the CT scan of the abdomen. We'll check a MRI of the abdomen- however the renal cysts and nephrolithiasis in her right kidney, a pyelonephritis seem to be on the left side at the left CV angle is tender on admission. Also HIV-negative - No leukocytosis - continue to follow CBC   Nephrolithiasis:  - Seen in abdominal CT in the R kidney - however her pain is mostly in the left flank area.  - IV fluids  - Control pain   Renal Cyst :  - Will need outpatient follow up.    Code Status: Full Family Communication: Husband at bedside  Disposition Plan: Inpatient   Consultants:  ID  Procedures:  None at this time  Antibiotics:  Ceftriaxone IV 7/02 >7/03  Ertapenem IV 7/04>>  HPI/Subjective: Feeling better today. Denies fever, chills, abdominal/suprapubic pain.   Objective: Filed Vitals:   01/09/13 0500 01/09/13 1349 01/09/13 2154 01/10/13 0441  BP: 104/66 94/59 101/66 97/59  Pulse: 80 81 62 58  Temp: 100.3 F (37.9 C) 98.6 F (37 C) 98.5 F (36.9 C) 98.3 F (36.8 C)  TempSrc: Oral Oral Oral Oral  Resp: 16 18 20 18   Height:      Weight: 78.1 kg (172 lb 2.9 oz)   78 kg (171 lb 15.3 oz)  SpO2: 98% 99% 99% 98%    Intake/Output Summary (Last 24 hours) at 01/10/13 1012 Last data filed at 01/10/13 0830  Gross per 24 hour  Intake 3383.25 ml  Output      0 ml  Net 3383.25 ml    Filed Weights   01/08/13 1933 01/09/13 0500 01/10/13 0441  Weight: 78 kg (171 lb 15.3 oz) 78.1 kg (172 lb 2.9 oz) 78 kg (171 lb 15.3 oz)    Exam:   General: A & O x3, NAD, sitting on bedside   Cardiovascular: RRR, no m/g/r, no peripheral edema   Respiratory: CTA bilaterally, no wheezes, rales, or rhonchi   Abdomen: BS+, soft, NT, ND, no organomegaly, CVA tenderness minimal, improved (Left). No midline/lumbar spine area tenderness.   Musculoskeletal: moving all extremities    Data Reviewed: Basic Metabolic Panel:  Recent Labs Lab 01/08/13 1409 01/09/13 0835  NA 135 133*  K 3.6 3.2*  CL 100 103  CO2 24 20  GLUCOSE 151* 101*  BUN 7 5*  CREATININE 0.90 0.66  CALCIUM 8.8 7.9*   CBC:  Recent Labs Lab 01/08/13 1409 01/09/13 0835 01/10/13 0858  WBC 11.3* 9.6 6.0  NEUTROABS 9.7*  --   --   HGB 12.6 11.6* 10.7*  HCT 37.3 34.6* 31.5*  MCV 94.9 94.3 95.2  PLT 185 171 180     Recent Results (from the past 240 hour(s))  URINE CULTURE     Status: None   Collection Time    01/08/13  1:01 PM      Result Value Range Status   Specimen  Description URINE, CLEAN CATCH   Final   Special Requests NONE   Final   Culture  Setup Time 01/08/2013 17:39   Final   Colony Count >=100,000 COLONIES/ML   Final   Culture     Final   Value: ESCHERICHIA COLI     Note: Confirmed Extended Spectrum Beta-Lactamase Producer (ESBL) CRITICAL RESULT CALLED TO, READ BACK BY AND VERIFIED WITH: DORA GARDNER ON 01/10/2013 AT 04:34 A.M.   Report Status 01/10/2013 FINAL   Final   Organism ID, Bacteria ESCHERICHIA COLI   Final  CULTURE, BLOOD (ROUTINE X 2)     Status: None   Collection Time    01/08/13  4:35 PM      Result Value Range Status   Specimen Description BLOOD ARM LEFT   Final   Special Requests BOTTLES DRAWN AEROBIC ONLY 3CC   Final   Culture  Setup Time 01/08/2013 23:10   Final   Culture     Final   Value:        BLOOD CULTURE RECEIVED NO GROWTH TO DATE CULTURE WILL BE HELD FOR 5  DAYS BEFORE ISSUING A FINAL NEGATIVE REPORT   Report Status PENDING   Incomplete  CULTURE, BLOOD (ROUTINE X 2)     Status: None   Collection Time    01/08/13  4:37 PM      Result Value Range Status   Specimen Description BLOOD HAND LEFT   Final   Special Requests BOTTLES DRAWN AEROBIC ONLY 3CC   Final   Culture  Setup Time 01/08/2013 23:11   Final   Culture     Final   Value:        BLOOD CULTURE RECEIVED NO GROWTH TO DATE CULTURE WILL BE HELD FOR 5 DAYS BEFORE ISSUING A FINAL NEGATIVE REPORT   Report Status PENDING   Incomplete     Studies: Ct Abdomen Pelvis Wo Contrast  01/08/2013   *RADIOLOGY REPORT*  Clinical Data: Right flank pain  CT ABDOMEN AND PELVIS WITHOUT CONTRAST  Technique:  Multidetector CT imaging of the abdomen and pelvis was performed following the standard protocol without intravenous contrast.  Comparison: None.  Findings: Subsegmental atelectasis at the right lung base.  No ureteral calculus.  No hydronephrosis.  Small calculi are present and scattered in the collecting system of the right kidney. The lower pole of the right kidney has been replaced by two complex cystic areas.  Centrally, there characterized as simple fluid but there are rim calcifications.  No obvious solid elements.   There is a new ill-defined hypodensity in the medial segment of the left lobe of the liver adjacent to the falciform ligament anteriorly on image 27. This is favored represent interval development of focal fatty infiltration.  The borders are geographic.  There is no mass effect upon adjacent tissues.  Spleen, pancreas or gallbladder, adrenal glands are within normal limits.  Bladder, uterus, and adnexa are within normal limits.  The appendix is not visualized.  No free fluid.  IMPRESSION: No evidence of ureteral calculus or ureteral obstruction.  Right nephrolithiasis.  The lower pole of the right kidney is atrophic and is now occupied by two complex cysts as described above.  These are favored to  represent benign complex cysts.  As clinically indicated, MRI can be performed to further characterize.   Original Report Authenticated By: Jolaine Click, M.D.    Scheduled Meds: . docusate sodium  100 mg Oral BID  . ertapenem  1 g Intravenous Q24H  .  heparin  5,000 Units Subcutaneous Q8H   Continuous Infusions:    Active Problems:   ESBL (extended spectrum beta-lactamase) producing bacteria infection   Acute pyelonephritis    Melinda Muskrat PA-S [facility]  01/10/2013, 10:12 AM  LOS: 2 days    Attending - Patient seen and examined, the with the above assessment and plan, she is clinically improved today with Rocephin, she has significantly less tenderness in the left CVA area. Surprisingly, her urine culture shows ESBL Escherichia coli, spoke with Dr. Daiva Eves who suggested we try a few days of IV ertapenem. I will get a MRI of her abdomen to better evaluate if there is any structural lesion in the kidneys- that are causing her to have recurrent pyelonephritis.  S Vadhir Mcnay

## 2013-01-10 NOTE — Progress Notes (Signed)
Nutrition Brief Note  Malnutrition Screening Tool result is inaccurate.  Please consult if nutrition needs are identified.  Katie Grisela Mesch, RD, LDN Pager #: [phone] After-Hours Pager #: [phone]  

## 2013-01-11 ENCOUNTER — Inpatient Hospital Stay (HOSPITAL_COMMUNITY): Payer: Medicaid Other

## 2013-01-11 DIAGNOSIS — Q619 Cystic kidney disease, unspecified: Secondary | ICD-10-CM

## 2013-01-11 MED ORDER — GADOBENATE DIMEGLUMINE 529 MG/ML IV SOLN
17.0000 mL | Freq: Once | INTRAVENOUS | Status: AC | PRN
  Administered 2013-01-11: 17 mL via INTRAVENOUS

## 2013-01-11 NOTE — Progress Notes (Signed)
PATIENT DETAILS Name: Melinda Spears Age: 37 y.o. Sex: female Date of Birth: 1976/04/10 Admit Date: 01/08/2013 Admitting Physician Drema Dallas, MD PCP:No primary provider on file.  Subjective: No major complaints. No further Left CVA tenderness  Assessment/Plan: Active Problems:   ESBL E Coli Left Pyelonephritis -afebrile, doing well, Left CVA tenderness has resolved -Day 2 of Invanz -begin discharge planning-will have Case Management see-if home health sevices can provide her IV antibiotics at home -spoke and explained the plan as outlined above-via translator in the phone. Patient wanted more information regarding PICC line -MRI kidney's pending -HIV neg -A1C 5.4  Nephrolithiasis:  - Seen in abdominal CT in the R kidney - however her pain is mostly in the left flank area.   Renal Cyst :  - await MRI kidneys   Disposition: Remain inpatient  DVT Prophylaxis: Prophylactic Heparin   Code Status: Full code  Family Communication None  Procedures:  None  CONSULTS:  None   MEDICATIONS: Scheduled Meds: . docusate sodium  100 mg Oral BID  . ertapenem  1 g Intravenous Q24H  . heparin  5,000 Units Subcutaneous Q8H   Continuous Infusions:  PRN Meds:.acetaminophen, acetaminophen, HYDROcodone-acetaminophen, morphine injection, ondansetron (ZOFRAN) IV, ondansetron  Antibiotics: Anti-infectives   Start     Dose/Rate Route Frequency Ordered Stop   01/10/13 1100  ertapenem (INVANZ) 1 g in sodium chloride 0.9 % 50 mL IVPB     1 g 100 mL/hr over 30 Minutes Intravenous Every 24 hours 01/10/13 0944     01/08/13 2200  cefTRIAXone (ROCEPHIN) 1 g in dextrose 5 % 50 mL IVPB  Status:  Discontinued     1 g 100 mL/hr over 30 Minutes Intravenous Every 24 hours 01/08/13 2037 01/10/13 0944   01/08/13 0000  sulfamethoxazole-trimethoprim (SEPTRA DS) 800-160 MG per tablet     1 tablet Oral Every 12 hours 01/08/13 1530         PHYSICAL EXAM: Vital signs in last 24  hours: Filed Vitals:   01/10/13 0441 01/10/13 1433 01/10/13 2030 01/11/13 0500  BP: 97/59 102/67 118/71 102/65  Pulse: 58 73 67 60  Temp: 98.3 F (36.8 C) 98.4 F (36.9 C) 99.3 F (37.4 C) 98.8 F (37.1 C)  TempSrc: Oral Oral Oral Oral  Resp: 18 18 20 18   Height:      Weight: 78 kg (171 lb 15.3 oz)   77.2 kg (170 lb 3.1 oz)  SpO2: 98% 100% 100% 98%    Weight change: -0.8 kg (-1 lb 12.2 oz) Filed Weights   01/09/13 0500 01/10/13 0441 01/11/13 0500  Weight: 78.1 kg (172 lb 2.9 oz) 78 kg (171 lb 15.3 oz) 77.2 kg (170 lb 3.1 oz)   Body mass index is 29.2 kg/(m^2).   Gen Exam: Awake and alert with clear speech.   Neck: Supple, No JVD.   Chest: B/L Clear.   CVS: S1 S2 Regular, no murmurs.  Abdomen: soft, BS +, non tender, non distended. No left CVA tenderness today Extremities: no edema, lower extremities warm to touch. Neurologic: Non Focal.   Skin: No Rash.   Wounds: N/A.    Intake/Output from previous day:  Intake/Output Summary (Last 24 hours) at 01/11/13 1008 Last data filed at 01/11/13 0500  Gross per 24 hour  Intake     50 ml  Output    700 ml  Net   -650 ml     LAB RESULTS: CBC  Recent Labs Lab 01/08/13 1409 01/09/13 0835 01/10/13  0858  WBC 11.3* 9.6 6.0  HGB 12.6 11.6* 10.7*  HCT 37.3 34.6* 31.5*  PLT 185 171 180  MCV 94.9 94.3 95.2  MCH 32.1 31.6 32.3  MCHC 33.8 33.5 34.0  RDW 13.5 13.7 14.0  LYMPHSABS 1.0  --   --   MONOABS 0.6  --   --   EOSABS 0.0  --   --   BASOSABS 0.0  --   --     Chemistries   Recent Labs Lab 01/08/13 1409 01/09/13 0835 01/10/13 0858  NA 135 133* 138  K 3.6 3.2* 3.6  CL 100 103 107  CO2 24 20 23   GLUCOSE 151* 101* 95  BUN 7 5* 3*  CREATININE 0.90 0.66 0.63  CALCIUM 8.8 7.9* 8.3*    CBG: No results found for this basename: GLUCAP,  in the last 168 hours  GFR Estimated Creatinine Clearance: 98.7 ml/min (by C-G formula based on Cr of 0.63).  Coagulation profile No results found for this basename: INR,  PROTIME,  in the last 168 hours  Cardiac Enzymes No results found for this basename: CK, CKMB, TROPONINI, MYOGLOBIN,  in the last 168 hours  No components found with this basename: POCBNP,  No results found for this basename: DDIMER,  in the last 72 hours  Recent Labs  01/09/13 0835  HGBA1C 5.4   No results found for this basename: CHOL, HDL, LDLCALC, TRIG, CHOLHDL, LDLDIRECT,  in the last 72 hours No results found for this basename: TSH, T4TOTAL, FREET3, T3FREE, THYROIDAB,  in the last 72 hours No results found for this basename: VITAMINB12, FOLATE, FERRITIN, TIBC, IRON, RETICCTPCT,  in the last 72 hours No results found for this basename: LIPASE, AMYLASE,  in the last 72 hours  Urine Studies No results found for this basename: UACOL, UAPR, USPG, UPH, UTP, UGL, UKET, UBIL, UHGB, UNIT, UROB, ULEU, UEPI, UWBC, URBC, UBAC, CAST, CRYS, UCOM, BILUA,  in the last 72 hours  MICROBIOLOGY: Recent Results (from the past 240 hour(s))  URINE CULTURE     Status: None   Collection Time    01/08/13  1:01 PM      Result Value Range Status   Specimen Description URINE, CLEAN CATCH   Final   Special Requests NONE   Final   Culture  Setup Time 01/08/2013 17:39   Final   Colony Count >=100,000 COLONIES/ML   Final   Culture     Final   Value: ESCHERICHIA COLI     Note: Confirmed Extended Spectrum Beta-Lactamase Producer (ESBL) CRITICAL RESULT CALLED TO, READ BACK BY AND VERIFIED WITH: DORA GARDNER ON 01/10/2013 AT 04:34 A.M.   Report Status 01/10/2013 FINAL   Final   Organism ID, Bacteria ESCHERICHIA COLI   Final  CULTURE, BLOOD (ROUTINE X 2)     Status: None   Collection Time    01/08/13  4:35 PM      Result Value Range Status   Specimen Description BLOOD ARM LEFT   Final   Special Requests BOTTLES DRAWN AEROBIC ONLY 3CC   Final   Culture  Setup Time 01/08/2013 23:10   Final   Culture     Final   Value:        BLOOD CULTURE RECEIVED NO GROWTH TO DATE CULTURE WILL BE HELD FOR 5 DAYS BEFORE  ISSUING A FINAL NEGATIVE REPORT   Report Status PENDING   Incomplete  CULTURE, BLOOD (ROUTINE X 2)     Status: None   Collection Time  01/08/13  4:37 PM      Result Value Range Status   Specimen Description BLOOD HAND LEFT   Final   Special Requests BOTTLES DRAWN AEROBIC ONLY 3CC   Final   Culture  Setup Time 01/08/2013 23:11   Final   Culture     Final   Value:        BLOOD CULTURE RECEIVED NO GROWTH TO DATE CULTURE WILL BE HELD FOR 5 DAYS BEFORE ISSUING A FINAL NEGATIVE REPORT   Report Status PENDING   Incomplete  URINE CULTURE     Status: None   Collection Time    01/09/13  6:14 AM      Result Value Range Status   Specimen Description URINE, RANDOM   Final   Special Requests NONE   Final   Culture  Setup Time 01/09/2013 14:16   Final   Colony Count PENDING   Incomplete   Culture Culture reincubated for better growth   Final   Report Status PENDING   Incomplete    RADIOLOGY STUDIES/RESULTS: Ct Abdomen Pelvis Wo Contrast  01/08/2013   *RADIOLOGY REPORT*  Clinical Data: Right flank pain  CT ABDOMEN AND PELVIS WITHOUT CONTRAST  Technique:  Multidetector CT imaging of the abdomen and pelvis was performed following the standard protocol without intravenous contrast.  Comparison: None.  Findings: Subsegmental atelectasis at the right lung base.  No ureteral calculus.  No hydronephrosis.  Small calculi are present and scattered in the collecting system of the right kidney. The lower pole of the right kidney has been replaced by two complex cystic areas.  Centrally, there characterized as simple fluid but there are rim calcifications.  No obvious solid elements.   There is a new ill-defined hypodensity in the medial segment of the left lobe of the liver adjacent to the falciform ligament anteriorly on image 27. This is favored represent interval development of focal fatty infiltration.  The borders are geographic.  There is no mass effect upon adjacent tissues.  Spleen, pancreas or gallbladder,  adrenal glands are within normal limits.  Bladder, uterus, and adnexa are within normal limits.  The appendix is not visualized.  No free fluid.  IMPRESSION: No evidence of ureteral calculus or ureteral obstruction.  Right nephrolithiasis.  The lower pole of the right kidney is atrophic and is now occupied by two complex cysts as described above.  These are favored to represent benign complex cysts.  As clinically indicated, MRI can be performed to further characterize.   Original Report Authenticated By: Jolaine Click, M.D.    Jeoffrey Massed, MD  [facility] Pager:[phone]  If 7PM-7AM, please contact night-coverage [email or web address] Password [email or web address] 01/11/2013, 10:08 AM   LOS: 3 days

## 2013-01-11 NOTE — Progress Notes (Signed)
During rounds Dr. Jerral Ralph used spanish interpretor via phone to inform pt. 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 So young.  This time there is a different kind of bacteria which is sensitive to antibiotic, since there is no good oral antibiotic, wants to keep her in the hospital for a couple more day and then send her home with IV antibiotics which means placing a PICC line in her arm and go home with IV antibiotics.  Will see if we can arrange home IV antibiotics, so will not be able to discharge home today.  Dr. Rennis Petty then asked pt. If she could inject a needle in her arm and give it IM.  Pt. Said no, he asked if a family member or friend could give it IM, pt. Said yes.  Then MD asked which would she prefer, PICC line or IM. Pt. Not sure.  We will give her some information about the PICC line.  Dr. Jerral Ralph briefly explained how the PICC line worked.  Will continue to follow and consult CM for H/H.

## 2013-01-11 NOTE — Care Management Note (Addendum)
Cm spoke with patient and family at bedside concerning discharge planning. [facility] to provide [facility] services, pt admitted as self-pay. Patient has orange card with [facility] in which she is followed for primary care.  Pt will require Spanish Interpreter for Iv teaching. [facility] notified of referral. Md orders, facesheet,H/P, and progress notes faxed to [facility] at [phone]. Cm spoke with [facility] [place] concerning the referral.    Melinda Spears [phone]

## 2013-01-12 LAB — URINE CULTURE: Colony Count: >=100000

## 2013-01-12 MED ORDER — SODIUM CHLORIDE 0.9 % IV SOLN: 1.0000 g | INTRAVENOUS | Status: DC

## 2013-01-12 MED ORDER — SODIUM CHLORIDE 0.9 % IJ SOLN: 10.0000 mL | INTRAMUSCULAR | Status: DC | PRN

## 2013-01-12 MED ORDER — SODIUM CHLORIDE 0.9 % IJ SOLN: 10.0000 mL | Freq: Two times a day (BID) | INTRAMUSCULAR | Status: DC

## 2013-01-12 MED ORDER — HEPARIN SOD (PORK) LOCK FLUSH 100 UNIT/ML IV SOLN
250.0000 [IU] | INTRAVENOUS | Status: AC | PRN
  Administered 2013-01-12: 250 [IU]

## 2013-01-12 NOTE — Discharge Summary (Addendum)
PATIENT DETAILS Name: Melinda Spears Age: 37 y.o. Sex: female Date of Birth: August 04, 1975 MRN: [id number]. Admit Date: 01/08/2013 Admitting Physician: Drema Dallas, MD PCP:No primary provider on file.  Recommendations for Outpatient Follow-up:  1. Continue Ertapenem for 10 more days from 01/12/13 2.  MRI of the abdomen every 6 months to assess right kidney lesions.   PRIMARY DISCHARGE DIAGNOSIS:  Active Problems:   ESBL (extended spectrum beta-lactamase) producing bacteria infection   Acute pyelonephritis      PAST MEDICAL HISTORY: Past Medical History  Diagnosis Date  . Blood transfusion 2009    s/p kidney surgery  . Acute meniscal tear, lateral     right leg  . Pain, dental   . Knee effusion, right     joint  . Hx successful VBAC (vaginal birth after cesarean), currently pregnant   . Kidney stone 2009  . Kidney calculus     right staghorn    DISCHARGE MEDICATIONS:   Medication List    STOP taking these medications       NAPROXEN PO      TAKE these medications       sodium chloride 0.9 % SOLN 50 mL with ertapenem 1 G SOLR 1 g  Inject 1 g into the vein daily.        ALLERGIES:  No Known Allergies  BRIEF HPI:  See H&P, Labs, Consult and Test reports for all details in brief, patient was admitted for fever and left flank pain. Found to have pyelonephritis, and admitted to the hospitalist service.Urine cultures were subsequently positive for ESBL E Coli.  CONSULTATIONS:   None  PERTINENT RADIOLOGIC STUDIES: Ct Abdomen Pelvis Wo Contrast  01/08/2013   *RADIOLOGY REPORT*  Clinical Data: Right flank pain  CT ABDOMEN AND PELVIS WITHOUT CONTRAST  Technique:  Multidetector CT imaging of the abdomen and pelvis was performed following the standard protocol without intravenous contrast.  Comparison: None.  Findings: Subsegmental atelectasis at the right lung base.  No ureteral calculus.  No hydronephrosis.  Small calculi are present and scattered in the collecting  system of the right kidney. The lower pole of the right kidney has been replaced by two complex cystic areas.  Centrally, there characterized as simple fluid but there are rim calcifications.  No obvious solid elements.   There is a new ill-defined hypodensity in the medial segment of the left lobe of the liver adjacent to the falciform ligament anteriorly on image 27. This is favored represent interval development of focal fatty infiltration.  The borders are geographic.  There is no mass effect upon adjacent tissues.  Spleen, pancreas or gallbladder, adrenal glands are within normal limits.  Bladder, uterus, and adnexa are within normal limits.  The appendix is not visualized.  No free fluid.  IMPRESSION: No evidence of ureteral calculus or ureteral obstruction.  Right nephrolithiasis.  The lower pole of the right kidney is atrophic and is now occupied by two complex cysts as described above.  These are favored to represent benign complex cysts.  As clinically indicated, MRI can be performed to further characterize.   Original Report Authenticated By: Jolaine Click, M.D.   Mr Abdomen W Wo Contrast  01/11/2013   *RADIOLOGY REPORT*  Clinical Data: Recurrent pyelonephritis.  Complex right renal cystic lesions on CT scan.  Right nephrolithiasis.  MRI ABDOMEN WITH AND WITHOUT CONTRAST  Technique:  Multiplanar multisequence MR imaging of the abdomen was performed both before and after administration of intravenous contrast.  Contrast:  17mL MULTIHANCE GADOBENATE DIMEGLUMINE 529 MG/ML IV SOLN  Comparison: Multiple exams, including 01/08/2013  Findings: Right kidney measures 8.8 cm in length and the left kidney measures 13.1 cm in length.  A bilobed cyst with single internal septation in the right kidney lower pole measures 3.5 x 3.6 cm on image 54 of series 403.  This is mild but smooth wall thickening along the septation and along the posterior margin of the cyst.  The CT scan revealed punctate calcifications along the  wall margins.  No nodular enhancing soft tissue elements.  Kidneys otherwise unremarkable.  Visualized segments of the liver, spleen, pancreas, adrenal glands, and gallbladder unremarkable.  IMPRESSION:  1.  Bosniak category IIF cyst of the right kidney lower pole has mild smooth enhancing septation and mild smooth wall thickening along the posterior cystic component, as well as punctate calcifications.  Lesions in this category are likely benign but warrant followup.  Initial follow-up by contrast enhanced CT or MRI in 6 months time is recommended.   Original Report Authenticated By: Gaylyn Rong, M.D.     PERTINENT LAB RESULTS: CBC:  Recent Labs  01/10/13 0858  WBC 6.0  HGB 10.7*  HCT 31.5*  PLT 180   CMET CMP     Component Value Date/Time   NA 138 01/10/2013 0858   K 3.6 01/10/2013 0858   CL 107 01/10/2013 0858   CO2 23 01/10/2013 0858   GLUCOSE 95 01/10/2013 0858   BUN 3* 01/10/2013 0858   CREATININE 0.63 01/10/2013 0858   CALCIUM 8.3* 01/10/2013 0858   PROT 5.2* 12/03/2012 0505   ALBUMIN 2.3* 12/03/2012 0505   AST 16 12/03/2012 0505   ALT 18 12/03/2012 0505   ALKPHOS 68 12/03/2012 0505   BILITOT 0.3 12/03/2012 0505   GFRNONAA >90 01/10/2013 0858   GFRAA >90 01/10/2013 0858    GFR Estimated Creatinine Clearance: 98.9 ml/min (by C-G formula based on Cr of 0.63). No results found for this basename: LIPASE, AMYLASE,  in the last 72 hours No results found for this basename: CKTOTAL, CKMB, CKMBINDEX, TROPONINI,  in the last 72 hours No components found with this basename: POCBNP,  No results found for this basename: DDIMER,  in the last 72 hours No results found for this basename: HGBA1C,  in the last 72 hours No results found for this basename: CHOL, HDL, LDLCALC, TRIG, CHOLHDL, LDLDIRECT,  in the last 72 hours No results found for this basename: TSH, T4TOTAL, FREET3, T3FREE, THYROIDAB,  in the last 72 hours No results found for this basename: VITAMINB12, FOLATE, FERRITIN, TIBC, IRON,  RETICCTPCT,  in the last 72 hours Coags: No results found for this basename: PT, INR,  in the last 72 hours Microbiology: Recent Results (from the past 240 hour(s))  URINE CULTURE     Status: None   Collection Time    01/08/13  1:01 PM      Result Value Range Status   Specimen Description URINE, CLEAN CATCH   Final   Special Requests NONE   Final   Culture  Setup Time 01/08/2013 17:39   Final   Colony Count >=100,000 COLONIES/ML   Final   Culture     Final   Value: ESCHERICHIA COLI     Note: Confirmed Extended Spectrum Beta-Lactamase Producer (ESBL) CRITICAL RESULT CALLED TO, READ BACK BY AND VERIFIED WITH: DORA GARDNER ON 01/10/2013 AT 04:34 A.M.   Report Status 01/10/2013 FINAL   Final   Organism ID, Bacteria ESCHERICHIA COLI   Final  CULTURE, BLOOD (ROUTINE X 2)     Status: None   Collection Time    01/08/13  4:35 PM      Result Value Range Status   Specimen Description BLOOD ARM LEFT   Final   Special Requests BOTTLES DRAWN AEROBIC ONLY 3CC   Final   Culture  Setup Time 01/08/2013 23:10   Final   Culture     Final   Value:        BLOOD CULTURE RECEIVED NO GROWTH TO DATE CULTURE WILL BE HELD FOR 5 DAYS BEFORE ISSUING A FINAL NEGATIVE REPORT   Report Status PENDING   Incomplete  CULTURE, BLOOD (ROUTINE X 2)     Status: None   Collection Time    01/08/13  4:37 PM      Result Value Range Status   Specimen Description BLOOD HAND LEFT   Final   Special Requests BOTTLES DRAWN AEROBIC ONLY 3CC   Final   Culture  Setup Time 01/08/2013 23:11   Final   Culture     Final   Value:        BLOOD CULTURE RECEIVED NO GROWTH TO DATE CULTURE WILL BE HELD FOR 5 DAYS BEFORE ISSUING A FINAL NEGATIVE REPORT   Report Status PENDING   Incomplete  URINE CULTURE     Status: None   Collection Time    01/09/13  6:14 AM      Result Value Range Status   Specimen Description URINE, RANDOM   Final   Special Requests NONE   Final   Culture  Setup Time 01/09/2013 14:16   Final   Colony Count >=100,000  COLONIES/ML   Final   Culture     Final   Value: Multiple bacterial morphotypes present, none predominant. Suggest appropriate recollection if clinically indicated.   Report Status 01/12/2013 FINAL   Final     BRIEF HOSPITAL COURSE:   Active Problems:   ESBL (extended spectrum beta-lactamase) producing bacteria infection   Acute pyelonephritis Patient was admitted, and started on Rocephin. This was her second episode of Pyelonephritis in almost less than a month. Urine and blood cultures were obtained, Urine cs +ve ESBL Ecoli, blood culture was negative till day of discharge.She was then started on IV Invanz after talking TO Dr Daiva Eves from the ID service, we will discharge her with 10 more days of IV Invanz. PICC line will be placed prior to discharge.She has been advised to follow up at the [facility] in 1 week.  -HIV neg,-A1C 5.4. MRI of the abdomen was done to see if patient had any structural lesions-it showed:  -Bosniak category IIF cyst of the right kidney lower pole has               mild smooth enhancing septation and mild smooth wall thickening               along the posterior cystic component, as well as punctate               calcifications. Lesions in this category are likely benign but               warrant followup.  Patient has been advised to have MRI of the abdomen every 6 months.  TODAY-DAY OF DISCHARGE:  Subjective:   Khira Cudmore Bustos today has no headache,no chest abdominal pain,no new weakness tingling or numbness, feels much better wants to go home today.   Objective:  Blood pressure 115/75, pulse 63, temperature 98.3 F (36.8 C), temperature source Oral, resp. rate 18, height 5\' 4"  (1.626 m), weight 77.4 kg (170 lb 10.2 oz), SpO2 97.00%.  Intake/Output Summary (Last 24 hours) at 01/12/13 1323 Last data filed at 01/12/13 0900  Gross per 24 hour  Intake    240 ml  Output    650 ml  Net   -410 ml   Filed Weights    01/10/13 0441 01/11/13 0500 01/12/13 0458  Weight: 78 kg (171 lb 15.3 oz) 77.2 kg (170 lb 3.1 oz) 77.4 kg (170 lb 10.2 oz)    Exam Awake Alert, Oriented *3, No new F.N deficits, Normal affect [place].AT,PERRAL Supple Neck,No JVD, No cervical lymphadenopathy appriciated.  Symmetrical Chest wall movement, Good air movement bilaterally, CTAB RRR,No Gallops,Rubs or new Murmurs, No Parasternal Heave +ve B.Sounds, Abd Soft, Non tender, No organomegaly appriciated, No rebound -guarding or rigidity. No Cyanosis, Clubbing or edema, No new Rash or bruise  DISCHARGE CONDITION: Stable  DISPOSITION: Home  DISCHARGE INSTRUCTIONS:    Activity:  As tolerated   Diet recommendation: Regular Diet      Discharge Orders   Future Orders Complete By Expires     Call MD for:  severe uncontrolled pain  As directed     Call MD for:  temperature >100.4  As directed     Diet general  As directed     Increase activity slowly  As directed        Follow-up Information   Follow up with [facility]    . Schedule an appointment as soon as possible for a visit in 1 week.   Contact information:   [address] [place] [address] [phone]         Total Time spent on discharge equals 45 minutes.  SignedJeoffrey Massed 01/12/2013 1:23 PM

## 2013-01-12 NOTE — Progress Notes (Addendum)
Received callc.  from [place] RN on 5W concerning discharge [facility] with IV antibiotics that was set up with CM Yvette Rack 01/11/13. Contacted [facility] spoke with Chassity confirmed that  [facility]  visit is set up for tomorrow 7/7. [facility] made aware that pt is spanish speaking only and family will arrange to have a englsih speaking adult at home.

## 2013-01-12 NOTE — Progress Notes (Signed)
Explained benefits and risks of PICC line insertion with use of interpetor phone.  Interpetor #[id number].  Pt. Expressed understanding verbally and is agreeable.  Consent form signed.

## 2013-01-12 NOTE — Progress Notes (Signed)
Peripherally Inserted Central Catheter/Midline Placement  The IV Nurse has discussed with the patient and/or persons authorized to consent for the patient, the purpose of this procedure and the potential benefits and risks involved with this procedure.  The benefits include less needle sticks, lab draws from the catheter and patient may be discharged home with the catheter.  Risks include, but not limited to, infection, bleeding, blood clot (thrombus formation), and puncture of an artery; nerve damage and irregular heat beat.  Alternatives to this procedure were also discussed.  PICC/Midline Placement Documentation  PICC / Midline Single Lumen 01/12/13 PICC Right Cephalic (Active)       Ethelda Chick 01/12/2013, 6:59 PM

## 2013-01-12 NOTE — Progress Notes (Signed)
Patient discharged home with husband and son, discharge instructions printed and gone over with family and patient.  Informed patient to call doctor for an appointment in 1 week and if she has temperature or pain to immediately call her doctor.  [facility] number given to family and I placed a call to home health about the antibiotics the patient will be taking.  Prescription given to patient and they were told to call home health at 8am on 01/13/13 for a time for home health to come out.  [facility] advised [place] that the patient will give the prescription to the home health nurse when she arrives.  Patient told she may shower after wrapping picc to keep it from getting wet.  Family told the importance of washing hands before touching picc or area of insertion.  Patient and family vocalized understanding of instructions.  Patient left via wheelchair, denies pain.

## 2013-01-14 LAB — CULTURE, BLOOD (ROUTINE X 2): Culture: NO GROWTH

## 2013-01-20 ENCOUNTER — Encounter: Payer: Self-pay | Admitting: Internal Medicine

## 2013-01-20 ENCOUNTER — Ambulatory Visit: Payer: No Typology Code available for payment source | Attending: Family Medicine | Admitting: Internal Medicine

## 2013-01-20 VITALS — BP 120/89 | HR 65 | Temp 98.9°F | Resp 16 | Ht 65.0 in | Wt 167.0 lb

## 2013-01-20 DIAGNOSIS — Z1619 Resistance to other specified beta lactam antibiotics: Secondary | ICD-10-CM

## 2013-01-20 DIAGNOSIS — B9689 Other specified bacterial agents as the cause of diseases classified elsewhere: Secondary | ICD-10-CM

## 2013-01-20 DIAGNOSIS — N12 Tubulo-interstitial nephritis, not specified as acute or chronic: Secondary | ICD-10-CM | POA: Insufficient documentation

## 2013-01-20 DIAGNOSIS — N281 Cyst of kidney, acquired: Secondary | ICD-10-CM | POA: Insufficient documentation

## 2013-01-20 DIAGNOSIS — N10 Acute tubulo-interstitial nephritis: Secondary | ICD-10-CM

## 2013-01-20 DIAGNOSIS — A499 Bacterial infection, unspecified: Secondary | ICD-10-CM

## 2013-01-20 NOTE — Progress Notes (Signed)
PT COMES IN ACCOMPANIED WITH SPANISH INTERPRETOR FOR R UPPER ARM SINGLE LINE PICC REMOVAL PER DOCTOR ORDERS. PT S/P KIDNEY INFECTION/HOSPITALIZED jULY 3RD AND D/C'D ON HOME IV ATB.Marland KitchenSITE INTACT,+ BLOOD RETURN/FLUSH EASILY. ECCHYMOSIS NOTED AT SITE BUT NO REDNESS OR SWELLING

## 2013-01-20 NOTE — Progress Notes (Signed)
Patient ID: Melinda Spears, female   DOB: 03-Feb-1976, 37 y.o.   MRN: [id number] Patient Demographics  Melinda Spears, is a 37 y.o. female  XLK:[id number]  [id number]  DOB - 1975/11/14  Chief Complaint  Patient presents with  . Follow-up        Subjective:   Melinda Spears today is here to establish primary care.she was recently seen in the hospital for ESBL pyelonephritis, which was her second pyelonephritis in a month.post discharge she has been doing well, she was discharged with a PICC line, and was getting Invanz at home. She has already completed more than 10 days of treatment, she [facility], that today was her last day of antibiotics. She claims that she does not have any further antibiotics at home, and was told that she needed a PICC line pulled out today as well. There is no history of fever, there is no history of any flank pain post his discharge. No history of nausea vomiting or diarrhea.  Patient has a  Past Medical History  Diagnosis Date  . Blood transfusion 2009    s/p kidney surgery  . Acute meniscal tear, lateral     right leg  . Pain, dental   . Knee effusion, right     joint  . Hx successful VBAC (vaginal birth after cesarean), currently pregnant   . Kidney stone 2009  . Kidney calculus     right staghorn  . Currently patient has no complaints. Patient has also has No headache, No chest pain, No abdominal pain,No Nausea, No new weakness tingling or numbness, No Cough or SOB.   Objective:    Filed Vitals:   01/20/13 1235  BP: 120/89  Pulse: 65  Temp: 98.9 F (37.2 C)  TempSrc: Oral  Resp: 16  Height: 5\' 5"  (1.651 m)  Weight: 167 lb (75.751 kg)  SpO2: 100%     ALLERGIES:  No Known Allergies  PAST MEDICAL HISTORY: Past Medical History  Diagnosis Date  . Blood transfusion 2009    s/p kidney surgery  . Acute meniscal tear, lateral     right leg  . Pain, dental   . Knee effusion, right     joint   . Hx successful VBAC (vaginal birth after cesarean), currently pregnant   . Kidney stone 2009  . Kidney calculus     right staghorn    PAST SURGICAL HISTORY: Past Surgical History  Procedure Laterality Date  . Cesarean section    . Kidney surgery  2009  . Appendectomy    . Nephrolithotomy  12/21/2006    percutaneous nephrolithotomy of right staghorn calculus- carbonate apatite  . Kidney stone surgery  02/20/2007    repeat pcnl for remaining right kidney stone fragments  . Kidney stone surgery  03/19/2007    for futher stone fragment removal    FAMILY HISTORY: Family History  Problem Relation Age of Onset  . Diabetes Father   . Coronary artery disease Father 30  . Other Neg Hx     MEDICATIONS AT HOME: Prior to Admission medications   Not on File    SOCIAL HISTORY:   reports that she has never smoked. She has never used smokeless tobacco. She reports that she does not drink alcohol or use illicit drugs.  REVIEW OF SYSTEMS:  Constitutional:   No   Fevers, chills, fatigue.  HEENT:    No headaches, Sore throat,   Cardio-vascular: No chest pain,  Orthopnea,  swelling in lower extremities, anasarca, palpitations  GI:  No abdominal pain, nausea, vomiting, diarrhea  Resp: No shortness of breath,  No coughing up of blood.No cough.No wheezing.  Skin:  no rash or lesions.  GU:  no dysuria, change in color of urine, no urgency or frequency.  No flank pain.  Musculoskeletal: No joint pain or swelling.  No decreased range of motion.  No back pain.  Psych: No change in mood or affect. No depression or anxiety.  No memory loss.   Exam  General appearance :Awake, alert, not in any distress. Speech Clear. Not toxic Looking HEENT: Atraumatic and Normocephalic, pupils equally reactive to light and accomodation Neck: supple, no JVD. No cervical lymphadenopathy.  Chest:Good air entry bilaterally, no added sounds  CVS: S1 S2 regular, no murmurs.  Abdomen: Bowel sounds  present, Non tender and not distended with no gaurding, rigidity or rebound.no CVA tenderness Extremities: B/L Lower Ext shows no edema, both legs are warm to touch Neurology: Awake alert, and oriented X 3, CN II-XII intact, Non focal Skin:No Rash Wounds:N/A    Data Review   CBC No results found for this basename: WBC, HGB, HCT, PLT, MCV, MCH, MCHC, RDW, NEUTRABS, LYMPHSABS, MONOABS, EOSABS, BASOSABS, BANDABS, BANDSABD,  in the last 168 hours  Chemistries   No results found for this basename: NA, K, CL, CO2, GLUCOSE, BUN, CREATININE, GFRCGP, CALCIUM, MG, AST, ALT, ALKPHOS, BILITOT,  in the last 168 hours ------------------------------------------------------------------------------------------------------------------ No results found for this basename: HGBA1C,  in the last 72 hours ------------------------------------------------------------------------------------------------------------------ No results found for this basename: CHOL, HDL, LDLCALC, TRIG, CHOLHDL, LDLDIRECT,  in the last 72 hours ------------------------------------------------------------------------------------------------------------------ No results found for this basename: TSH, T4TOTAL, FREET3, T3FREE, THYROIDAB,  in the last 72 hours ------------------------------------------------------------------------------------------------------------------ No results found for this basename: VITAMINB12, FOLATE, FERRITIN, TIBC, IRON, RETICCTPCT,  in the last 72 hours  Coagulation profile  No results found for this basename: INR, PROTIME,  in the last 168 hours    Assessment & Plan   ESBL pyelonephritis - Has completed more than 10 days of Invanz-started on 7/4 -remove PICC line - Monitor off antibiotics  Renal cysts -Repeat MRI in 6 months.  Gen. Health maintenance -refer to GYN for Pap smears  Return to clinic in one month

## 2013-01-22 ENCOUNTER — Other Ambulatory Visit (HOSPITAL_COMMUNITY): Payer: Self-pay | Admitting: Family Medicine

## 2013-01-22 DIAGNOSIS — N9489 Other specified conditions associated with female genital organs and menstrual cycle: Secondary | ICD-10-CM

## 2013-01-23 ENCOUNTER — Telehealth: Payer: Self-pay | Admitting: Family Medicine

## 2013-01-23 NOTE — Telephone Encounter (Signed)
[facility] being discharged [facility] because antibiotics are completed and pt back at work. If there are any concerns call [facility] at [facility].

## 2013-01-28 ENCOUNTER — Other Ambulatory Visit (HOSPITAL_COMMUNITY): Payer: No Typology Code available for payment source

## 2013-02-03 ENCOUNTER — Ambulatory Visit (HOSPITAL_COMMUNITY)
Admission: RE | Admit: 2013-02-03 | Discharge: 2013-02-03 | Disposition: A | Discharge status: Home / Self Care | Payer: No Typology Code available for payment source | Source: Ambulatory Visit | Attending: Family Medicine | Admitting: Family Medicine

## 2013-02-03 DIAGNOSIS — N9489 Other specified conditions associated with female genital organs and menstrual cycle: Secondary | ICD-10-CM | POA: Insufficient documentation

## 2013-02-20 ENCOUNTER — Ambulatory Visit: Payer: No Typology Code available for payment source | Attending: Family Medicine | Admitting: Internal Medicine

## 2013-02-20 VITALS — BP 103/69 | HR 64 | Temp 98.8°F | Resp 16 | Ht 64.75 in | Wt 167.0 lb

## 2013-02-20 DIAGNOSIS — N12 Tubulo-interstitial nephritis, not specified as acute or chronic: Secondary | ICD-10-CM | POA: Insufficient documentation

## 2013-02-20 LAB — CBC WITH DIFFERENTIAL/PLATELET
Basophils Absolute: 0 10*3/uL (ref 0.0–0.1)
Basophils Relative: 0 % (ref 0–1)
Hemoglobin: 12.5 g/dL (ref 12.0–15.0)
MCHC: 33 g/dL (ref 30.0–36.0)
Monocytes Relative: 9 % (ref 3–12)
Neutro Abs: 3 10*3/uL (ref 1.7–7.7)
Neutrophils Relative %: 53 % (ref 43–77)
RDW: 14 % (ref 11.5–15.5)

## 2013-02-20 LAB — COMPREHENSIVE METABOLIC PANEL
AST: 14 U/L (ref 0–37)
Albumin: 4.2 g/dL (ref 3.5–5.2)
Alkaline Phosphatase: 70 U/L (ref 39–117)
Potassium: 4.4 mEq/L (ref 3.5–5.3)
Sodium: 139 mEq/L (ref 135–145)
Total Protein: 7 g/dL (ref 6.0–8.3)

## 2013-02-20 NOTE — Patient Instructions (Addendum)
Patient instructed to call tomorrow or Monday to obtain the result of her labs

## 2013-02-20 NOTE — Progress Notes (Signed)
Patient presents for follow up for kidney infection.

## 2013-02-20 NOTE — Progress Notes (Signed)
Patient ID: Melinda Spears, female   DOB: 1976/06/21, 37 y.o.   MRN: [id number]   CC:  HPI: 37 year old female recently hospitalized for ESBL pyelonephritis, discharged home with a PICC line, complete a ten-day course of ertapenem, PICC line removed, she is here to followup on her labs. Her last blood work was 7/4 and her creatinine was stable She also had an MRI of the abdomen that showed cyst in the lower pole of the right kidney. The patient states that she saw a urologist in [facility] she is unable to give me his name. I did instruct her that the patient would need to followup with him within the next 3 months and may need a followup contrast MRI in the next 6 months.  No Known Allergies Past Medical History  Diagnosis Date  . Blood transfusion 2009    s/p kidney surgery  . Acute meniscal tear, lateral     right leg  . Pain, dental   . Knee effusion, right     joint  . Hx successful VBAC (vaginal birth after cesarean), currently pregnant   . Kidney stone 2009  . Kidney calculus     right staghorn   No current outpatient prescriptions on file prior to visit.   No current facility-administered medications on file prior to visit.   Family History  Problem Relation Age of Onset  . Diabetes Father   . Coronary artery disease Father 71  . Other Neg Hx    History   Social History  . Marital Status: Married    Spouse Name: N/A    Number of Children: 2  . Years of Education: N/A   Occupational History  . Cook at [employer]    Social History Main Topics  . Smoking status: Never Smoker   . Smokeless tobacco: Never Used  . Alcohol Use: No  . Drug Use: No  . Sexual Activity: Yes    Birth Control/ Protection: None   Other Topics Concern  . Not on file   Social History Narrative   Diet Coke one daily    Review of Systems  Constitutional: Negative for fever, chills, diaphoresis, activity change, appetite change and fatigue.  HENT: Negative for ear pain,  nosebleeds, congestion, facial swelling, rhinorrhea, neck pain, neck stiffness and ear discharge.   Eyes: Negative for pain, discharge, redness, itching and visual disturbance.  Respiratory: Negative for cough, choking, chest tightness, shortness of breath, wheezing and stridor.   Cardiovascular: Negative for chest pain, palpitations and leg swelling.  Gastrointestinal: Negative for abdominal distention.  Genitourinary: Negative for dysuria, urgency, frequency, hematuria, flank pain, decreased urine volume, difficulty urinating and dyspareunia.  Musculoskeletal: Negative for back pain, joint swelling, arthralgias and gait problem.  Neurological: Negative for dizziness, tremors, seizures, syncope, facial asymmetry, speech difficulty, weakness, light-headedness, numbness and headaches.  Hematological: Negative for adenopathy. Does not bruise/bleed easily.  Psychiatric/Behavioral: Negative for hallucinations, behavioral problems, confusion, dysphoric mood, decreased concentration and agitation.    Objective:   Filed Vitals:   02/20/13 1539  BP: 103/69  Pulse: 64  Temp: 98.8 F (37.1 C)  Resp: 16    Physical Exam  Constitutional: Appears well-developed and well-nourished. No distress.  HENT: Normocephalic. External right and left ear normal. Oropharynx is clear and moist.  Eyes: Conjunctivae and EOM are normal. PERRLA, no scleral icterus.  Neck: Normal ROM. Neck supple. No JVD. No tracheal deviation. No thyromegaly.  CVS: RRR, S1/S2 +, no murmurs, no gallops, no carotid bruit.  Pulmonary: Effort and breath sounds normal, no stridor, rhonchi, wheezes, rales.  Abdominal: Soft. BS +,  no distension, tenderness, rebound or guarding.  Musculoskeletal: Normal range of motion. No edema and no tenderness.  Lymphadenopathy: No lymphadenopathy noted, cervical, inguinal. Neuro: Alert. Normal reflexes, muscle tone coordination. No cranial nerve deficit. Skin: Skin is warm and dry. No rash noted.  Not diaphoretic. No erythema. No pallor.  Psychiatric: Normal mood and affect. Behavior, judgment, thought content normal.   Lab Results  Component Value Date   WBC 6.0 01/10/2013   HGB 10.7* 01/10/2013   HCT 31.5* 01/10/2013   MCV 95.2 01/10/2013   PLT 180 01/10/2013   Lab Results  Component Value Date   CREATININE 0.63 01/10/2013   BUN 3* 01/10/2013   NA 138 01/10/2013   K 3.6 01/10/2013   CL 107 01/10/2013   CO2 23 01/10/2013    Lab Results  Component Value Date   HGBA1C 5.4 01/09/2013   Lipid Panel  No results found for this basename: chol, trig, hdl, cholhdl, vldl, ldlcalc       Assessment and plan:   Patient Active Problem List   Diagnosis Date Noted  . ESBL (extended spectrum beta-lactamase) producing bacteria infection 01/10/2013  . Acute pyelonephritis 01/10/2013  . Gram negative septicemia 12/03/2012  . Renal cyst 12/03/2012  . Adnexal cyst 12/03/2012  . Flank pain 12/02/2012  . Pyelonephritis 12/02/2012  . Sepsis 12/02/2012  . Hypokalemia 12/02/2012       Recent pyelonephritis Will repeat CBC, kidney function, if stable patient can come back for routine followup in a couple of months

## 2013-02-24 ENCOUNTER — Encounter: Payer: No Typology Code available for payment source | Admitting: Obstetrics & Gynecology

## 2013-02-27 ENCOUNTER — Ambulatory Visit: Payer: No Typology Code available for payment source | Attending: Internal Medicine

## 2015-04-08 IMAGING — CR DG CHEST 1V PORT
1 series · 1 of 1 positions shown · non-contrast
Comparison: None.

CLINICAL DATA: Flank pain.

PORTABLE CHEST - 1 VIEW

[AP]
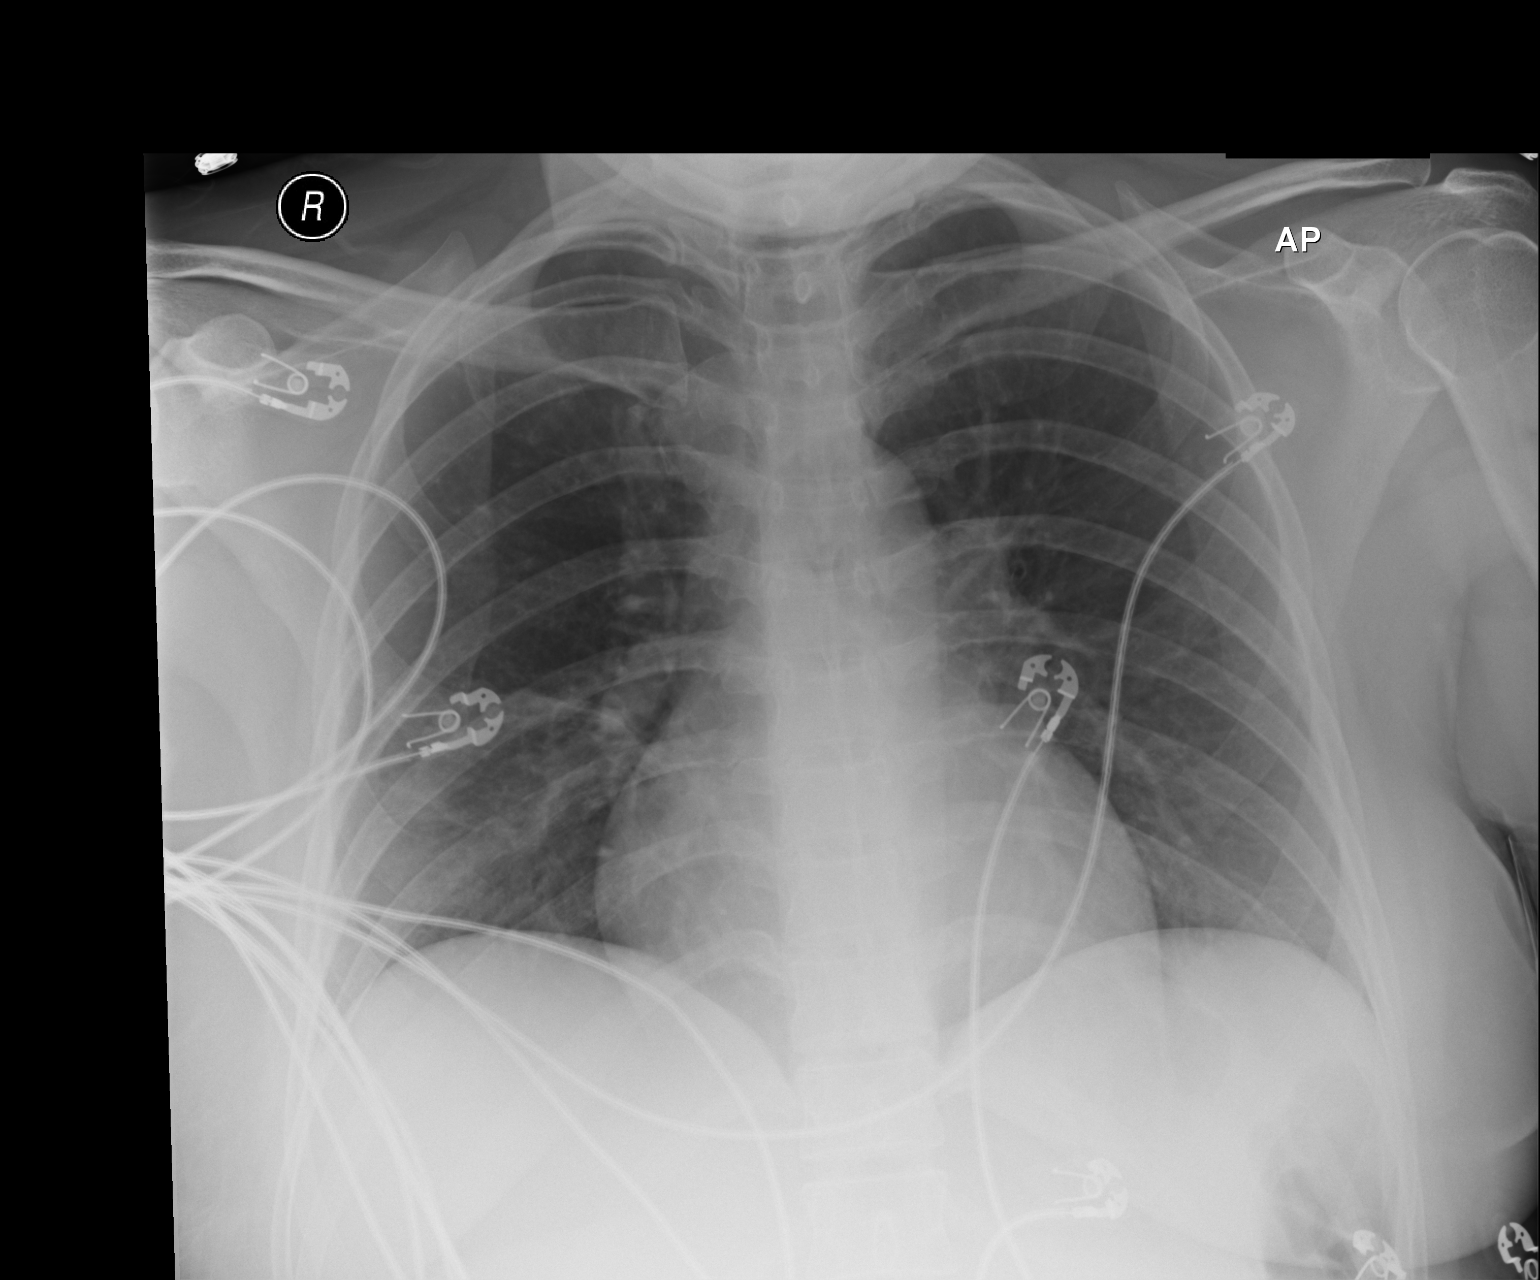

[1 of 1 positions shown; findings below may reference images not displayed]

FINDINGS: Cardiomediastinal silhouette is within normal limits.
The lungs are free of focal consolidations and pleural effusions.
No edema.
IMPRESSION: Negative exam.

## 2015-06-08 DIAGNOSIS — Z0130 Encounter for examination of blood pressure without abnormal findings: Secondary | ICD-10-CM

## 2015-06-10 DIAGNOSIS — Z7152 Counseling for family member of drug abuser: Secondary | ICD-10-CM

## 2015-06-10 NOTE — Congregational Nurse Program (Signed)
Congregational Nurse Program Note  Date of Encounter: 06/08/2015  Past Medical History: Past Medical History  Diagnosis Date  . Blood transfusion 2009    s/p kidney surgery  . Acute meniscal tear, lateral     right leg  . Pain, dental   . Knee effusion, right     joint  . Hx successful VBAC (vaginal birth after cesarean), currently pregnant   . Kidney stone 2009  . Kidney calculus     right staghorn    Encounter Details:     CNP Questionnaire - 06/10/15 1037    Patient Demographics   Is this a new or existing patient? New   Patient is considered a/an Immigrant   Patient Assistance   Patient's financial/insurance status Uninsured   Patient referred to apply for the following financial assistance [employer] Renewal   Food insecurities addressed Provided food supplies   Transportation assistance No   Assistance securing medications No   Educational health offerings Health literacy;Behavioral health   Encounter Details   Primary purpose of visit Other (comment)   Was an Emergency Department visit averted? Not Applicable   Does patient have a medical provider? No   Patient referred to Establish PCP   Was a mental health screening completed? (GAINS tool) No   Does patient have dental issues? Yes   Was a dental referral made? Yes  [facility] [place]   Since previous encounter, have you referred patient for abnormal blood pressure that resulted in a new diagnosis or medication change? No   Since previous encounter, have you referred patient for abnormal blood glucose that resulted in a new diagnosis or medication change? No   For Abstraction Use Only   Does patient have insurance? No       Vs. 102/43 p.43  RN mentioned need for a PCP consult. [facility].  Pt. Aware of need to renew orange card.  Food provided from [employer] pantry along with pampers.  Mother and 18 yr. Old sole providers.  Father is not in the home now.   Mother is looking for a Veterinary surgeoncounselor.  RN and BSW intern gave her the # for MSW K. Bradly BienenstockMartinez.  She will contact her to discuss needs.  RN to follow up with Ms. Bradly BienenstockMartinez also.  Information provided ref. Rental assistance through [employer], # given to call.  Flu shot given.

## 2015-06-10 NOTE — Congregational Nurse Program (Signed)
Congregational Nurse Program Note  Date of Encounter: 06/10/2015  Past Medical History: Past Medical History  Diagnosis Date  . Blood transfusion 2009    s/p kidney surgery  . Acute meniscal tear, lateral     right leg  . Pain, dental   . Knee effusion, right     joint  . Hx successful VBAC (vaginal birth after cesarean), currently pregnant   . Kidney stone 2009  . Kidney calculus     right staghorn    Encounter Details:     CNP Questionnaire - 06/10/15 1037    Patient Demographics   Is this a new or existing patient? New   Patient is considered a/an Immigrant   Patient Assistance   Patient's financial/insurance status Uninsured   Patient referred to apply for the following financial assistance [employer] Connects Renewal   Food insecurities addressed Provided food supplies   Transportation assistance No   Assistance securing medications No   Educational health offerings Health literacy;Behavioral health   Encounter Details   Primary purpose of visit Other (comment)   Was an Emergency Department visit averted? Not Applicable   Does patient have a medical provider? No   Patient referred to Establish PCP   Was a mental health screening completed? (GAINS tool) No   Does patient have dental issues? Yes   Was a dental referral made? Yes  [facility] [place]   Since previous encounter, have you referred patient for abnormal blood pressure that resulted in a new diagnosis or medication change? No   Since previous encounter, have you referred patient for abnormal blood glucose that resulted in a new diagnosis or medication change? No   For Abstraction Use Only   Does patient have insurance? No     client is separated from husband. States he is a drug abuser and needs counseling. He is unemployed.    She also looking for a counselor for her and her son who is [phone].  Referral to [facility] to get assistance with rent.  Encouraged to renew orange  card.  [facility].  RN also referred to [facility]. Gave her a brochure with address and tel. And baseline cost.

## 2015-06-18 ENCOUNTER — Ambulatory Visit: Payer: Self-pay

## 2015-06-30 ENCOUNTER — Ambulatory Visit: Payer: Self-pay | Attending: Family Medicine

## 2015-07-08 ENCOUNTER — Telehealth: Payer: Self-pay

## 2015-07-16 NOTE — Telephone Encounter (Signed)
MSW contacted pt. and [facility] for herself/children and to seek help for their father who is using street drugs.

## 2015-07-20 DIAGNOSIS — Z0130 Encounter for examination of blood pressure without abnormal findings: Secondary | ICD-10-CM

## 2015-07-20 DIAGNOSIS — Z7152 Counseling for family member of drug abuser: Secondary | ICD-10-CM

## 2015-08-05 NOTE — Congregational Nurse Program (Signed)
Congregational Nurse Program Note  Date of Encounter: 07/20/2015  Past Medical History: Past Medical History  Diagnosis Date  . Blood transfusion 2009    s/p kidney surgery  . Acute meniscal tear, lateral     right leg  . Pain, dental   . Knee effusion, right     joint  . Hx successful VBAC (vaginal birth after cesarean), currently pregnant   . Kidney stone 2009  . Kidney calculus     right staghorn    Encounter Details:     CNP Questionnaire - 07/20/15 1436    Patient Demographics   Is this a new or existing patient? New   Patient is considered a/an Immigrant   Race Latino/Hispanic   Patient Assistance   Location of Patient Assistance [place]   Patient's financial/insurance status Orange Card/Care Connects   Uninsured Patient Yes   Interventions Counseled to make appt. with provider   Patient referred to apply for the following financial assistance [employer] insecurities addressed Provided food supplies   Transportation assistance No   Assistance securing medications No   [employer], television/film set health   Encounter Details   Primary purpose of visit Family/Caregiver Support   Was an Emergency Department visit averted? Not Applicable   Does patient have a medical provider? No   Patient referred to [facility]   Was a mental health screening completed? (GAINS tool) No   Does patient have dental issues? Yes   Was a dental referral made? No  no referral made at this time   Since previous encounter, have you referred patient for abnormal blood pressure that resulted in a new diagnosis or medication change? No   Since previous encounter, have you referred patient for abnormal blood glucose that resulted in a new diagnosis or medication change? No

## 2015-11-04 ENCOUNTER — Encounter: Payer: Self-pay | Admitting: Pediatric Intensive Care

## 2015-11-04 DIAGNOSIS — Z0130 Encounter for examination of blood pressure without abnormal findings: Secondary | ICD-10-CM

## 2015-12-09 NOTE — Congregational Nurse Program (Signed)
Congregational Nurse Program Note  Date of Encounter: 11/04/2015  Past Medical History: Past Medical History  Diagnosis Date  . Blood transfusion 2009    s/p kidney surgery  . Acute meniscal tear, lateral     right leg  . Pain, dental   . Knee effusion, right     joint  . Hx successful VBAC (vaginal birth after cesarean), currently pregnant   . Kidney stone 2009  . Kidney calculus     right staghorn    Encounter Details:  Client complained of varicosities on left leg. Has been followed for blood pressure. Client has history of relationship stressor at home but states those have resolved. Client requests CNP contact medical provider. Client will have to call back after 5/1 to make appointment. Client will continue to follow up with CNP for blood pressure checks.

## 2016-01-19 ENCOUNTER — Encounter (HOSPITAL_COMMUNITY): Payer: Self-pay

## 2016-12-28 ENCOUNTER — Ambulatory Visit: Payer: Self-pay | Attending: Internal Medicine | Admitting: Internal Medicine

## 2016-12-28 ENCOUNTER — Encounter: Payer: Self-pay | Admitting: Internal Medicine

## 2016-12-28 VITALS — BP 106/70 | HR 57 | Temp 98.8°F | Resp 16 | Wt 149.0 lb

## 2016-12-28 DIAGNOSIS — I83893 Varicose veins of bilateral lower extremities with other complications: Secondary | ICD-10-CM

## 2016-12-28 DIAGNOSIS — I8393 Asymptomatic varicose veins of bilateral lower extremities: Secondary | ICD-10-CM | POA: Insufficient documentation

## 2016-12-28 DIAGNOSIS — Z131 Encounter for screening for diabetes mellitus: Secondary | ICD-10-CM

## 2016-12-28 DIAGNOSIS — N281 Cyst of kidney, acquired: Secondary | ICD-10-CM | POA: Insufficient documentation

## 2016-12-28 LAB — POCT GLYCOSYLATED HEMOGLOBIN (HGB A1C): HEMOGLOBIN A1C: 5.2

## 2016-12-28 NOTE — Patient Instructions (Signed)
Venas varicosas  (Varicose Veins)  Las venas varicosas son venas que se han agrandado y tornado sinuosas. Suelen aparecer en las piernas, pero también pueden verse en otra parte del cuerpo.  CAUSAS  Esta afección se presenta como consecuencia del mal funcionamiento de las válvulas de las venas, las cuales ayudan al retorno de la sangre desde las piernas hacia el corazón. Si estas válvulas se dañan, la sangre retrocede y regresa a las venas de la pierna, cerca de la superficie de la piel, lo que causa dilatación venosa.  FACTORES DE RIESGO  Las personas que están mucho tiempo paradas, las embarazadas o las personas con sobrepeso tienen más probabilidades de tener venas varicosas.  SIGNOS Y SÍNTOMAS  · Venas abultadas, azuladas y de aspecto sinuoso que se observan con mayor frecuencia en las piernas.  · Dolor o sensación de pesadez en las piernas. Estos síntomas pueden empeorar al final del día.  · Hinchazón de las piernas.  · Cambios en el color de la piel.    DIAGNÓSTICO  Generalmente, el médico puede diagnosticar las venas varicosas al examinarle las piernas. Además, puede recomendarle que se haga una ecografía de las venas de las piernas.  TRATAMIENTO  La mayoría de las venas varicosas pueden tratarse en casa. Sin embargo, hay otros tratamientos a disposición de las personas que tienen síntomas persistentes o desean mejorar la apariencia estética de las venas varicosas. Estas opciones de tratamiento incluyen lo siguiente:  · Escleroterapia. Se inyecta una solución en la vena para anularla.  · Tratamiento con láser. Se usa un rayo láser para calentar la vena y anularla.  · Ablación venosa por radiofrecuencia Se usa una corriente eléctrica que se produce mediante ondas de radio para anular la vena.  · Flebectomía. Se extirpa quirúrgicamente la vena a través de pequeñas incisiones que se hacen sobre la vena varicosa.  · Ligadura venosa y varicectomía. La vena se extirpa quirúrgicamente a través de incisiones que se  realizan sobre la vena varicosa después de haberla anudado (ligado).  INSTRUCCIONES PARA EL CUIDADO EN EL HOGAR  · No permanezca sentado o de pie en una posición durante mucho tiempo. No se siente con las piernas cruzadas. Descanse con las piernas elevadas durante el día.  · Use medias de compresión como le haya indicado su médico. Estas medias ayudan a evitar la formación de coágulos sanguíneos y a reducir la hinchazón de las piernas.  · No use otras prendas que le ajusten todo el contorno de las piernas, la pelvis o la cintura.  · Camine todo lo posible para aumentar la circulación de la sangre.  · A la noche, eleve el pie de la cama con bloques de 2 pulgadas.  · Si tiene un corte en la piel sobre la vena y la vena sangra, recuéstese con la pierna elevada y ejerza presión en el lugar con un paño limpio, hasta que deje de sangrar. Luego aplique un apósito (vendaje) sobre el corte. Consulte al médico si el sangrado continúa.    SOLICITE ATENCIÓN MÉDICA SI:  · La piel alrededor del tobillo empieza a agrietarse.  · Siente dolor, hay enrojecimiento, sensibilidad o hinchazón dura en la pierna sobre una vena.  · Está incómodo debido al dolor de la pierna.    Esta información no tiene como fin reemplazar el consejo del médico. Asegúrese de hacerle al médico cualquier pregunta que tenga.  Document Released: 04/05/2005 Document Revised: 10/18/2015 Document Reviewed: 12/28/2015  Elsevier Interactive Patient Education © 2017 [employer]

## 2016-12-28 NOTE — Progress Notes (Signed)
Patient ID: Melinda Spears, female    DOB: 1976-02-04  MRN: [id number]  CC: New Patient (Initial Visit) and Leg Pain   Subjective: Melinda Spears is a 41 y.o. female who presents for new pt visit Her concerns today include:  Pt c/o painful varicose veins on both legs LT>RT x several yrs -pt with standing, walking -does a lot of standing at current job and previous -mother and sister may have varicose veins   Patient Active Problem List   Diagnosis Date Noted  . Diabetes mellitus screening 12/28/2016  . ESBL (extended spectrum beta-lactamase) producing bacteria infection 01/10/2013  . Acute pyelonephritis 01/10/2013  . Gram negative septicemia (HCC) 12/03/2012  . Renal cyst 12/03/2012  . Adnexal cyst 12/03/2012  . Flank pain 12/02/2012  . Pyelonephritis 12/02/2012  . Sepsis (HCC) 12/02/2012  . Hypokalemia 12/02/2012     No current outpatient prescriptions on file prior to visit.   No current facility-administered medications on file prior to visit.     No Known Allergies  Social History   Social History  . Marital status: Married    Spouse name: N/A  . Number of children: 2  . 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 Smoking status: Never Smoker  . Smokeless tobacco: Never Used  . Alcohol use No  . Drug use: No  . Sexual activity: Yes    Birth control/ protection: None   Other Topics Concern  . Not on file   Social History Narrative   Diet Coke one daily    Family History  Problem Relation Age of Onset  . Diabetes Father   . Coronary artery disease Father 464  . Other Neg Hx     Past Surgical History:  Procedure Laterality Date  . APPENDECTOMY    . CESAREAN SECTION    . KIDNEY STONE SURGERY  02/20/2007   repeat pcnl for remaining right kidney stone fragments  . KIDNEY STONE SURGERY  03/19/2007   for futher stone fragment removal  . KIDNEY SURGERY  2009  . NEPHROLITHOTOMY   12/21/2006   percutaneous nephrolithotomy of right staghorn calculus- carbonate apatite    ROS: Review of Systems As above  PHYSICAL EXAM: BP 106/70   Pulse (!) 57   Temp 98.8 F (37.1 C) (Oral)   Resp 16   Wt 149 lb (67.6 kg)   SpO2 100%   BMI 24.99 kg/m   Physical Exam  General appearance - alert, well appearing, and in no distress Mental status - alert, oriented to person, place, and time, normal mood, behavior, speech, dress, motor activity, and thought processes Extremities -large tortuous varicose vein over the left shin that goes medially up the thigh. Smaller varicose veins noted in both feet, both popliteal fossa and the upper thigh. No tenderness on palpation of the tortuous varicose vein. Results for orders placed or performed in visit on 12/28/16  POCT glycosylated hemoglobin (Hb A1C)  Result Value Ref Range   Hemoglobin A1C 5.2     ASSESSMENT AND PLAN: 1. 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 These are due to incompetent valves in the veins. Since she is symptomatic especially in the left leg I will refer her to a vascular surgeon to see if anything can be done. In the meantime I recommend the wearing compression hose below the knee. - DME Other see comment  2.  Diabetes mellitus screening - POCT glycosylated hemoglobin (Hb A1C)  Patient was given the opportunity to ask questions.  Patient verbalized understanding of the plan and was able to repeat key elements of the plan.   Orders Placed This Encounter  Procedures  . DME Other see comment  . Consult to vascular surgery  . POCT glycosylated hemoglobin (Hb A1C)     Requested Prescriptions    No prescriptions requested or ordered in this encounter    No Follow-up on file.  Jonah Blue, MD, FACP

## 2017-01-03 ENCOUNTER — Telehealth: Payer: Self-pay | Admitting: Internal Medicine

## 2017-01-03 DIAGNOSIS — I83813 Varicose veins of bilateral lower extremities with pain: Secondary | ICD-10-CM

## 2017-01-03 NOTE — Telephone Encounter (Signed)
Pt. Came to facility stating that her PCP was going to refer her out to a specialist for her veins. There is no referral in the system for the pt. Please f/u

## 2017-01-03 NOTE — Telephone Encounter (Signed)
Will forward to pcp

## 2017-01-05 ENCOUNTER — Ambulatory Visit: Payer: Self-pay | Attending: Internal Medicine

## 2017-01-16 ENCOUNTER — Other Ambulatory Visit: Payer: Self-pay

## 2017-01-16 ENCOUNTER — Ambulatory Visit (INDEPENDENT_AMBULATORY_CARE_PROVIDER_SITE_OTHER): Payer: Self-pay | Admitting: Physician Assistant

## 2017-01-16 DIAGNOSIS — I83813 Varicose veins of bilateral lower extremities with pain: Secondary | ICD-10-CM

## 2017-03-07 ENCOUNTER — Encounter: Payer: Self-pay | Admitting: Vascular Surgery

## 2017-03-09 ENCOUNTER — Ambulatory Visit (HOSPITAL_COMMUNITY)
Admission: RE | Admit: 2017-03-09 | Discharge: 2017-03-09 | Disposition: A | Discharge status: Home / Self Care | Payer: No Typology Code available for payment source | Source: Ambulatory Visit | Attending: Vascular Surgery | Admitting: Vascular Surgery

## 2017-03-09 ENCOUNTER — Ambulatory Visit (INDEPENDENT_AMBULATORY_CARE_PROVIDER_SITE_OTHER): Payer: No Typology Code available for payment source | Admitting: Vascular Surgery

## 2017-03-09 ENCOUNTER — Encounter: Payer: Self-pay | Admitting: Vascular Surgery

## 2017-03-09 VITALS — BP 95/50 | HR 61 | Temp 97.5°F | Resp 14 | Ht 65.0 in | Wt 149.0 lb

## 2017-03-09 DIAGNOSIS — I83813 Varicose veins of bilateral lower extremities with pain: Secondary | ICD-10-CM | POA: Insufficient documentation

## 2017-03-09 DIAGNOSIS — L97909 Non-pressure chronic ulcer of unspecified part of unspecified lower leg with unspecified severity: Secondary | ICD-10-CM

## 2017-03-09 DIAGNOSIS — I83009 Varicose veins of unspecified lower extremity with ulcer of unspecified site: Secondary | ICD-10-CM

## 2017-03-09 NOTE — Progress Notes (Signed)
Patient ID: Melinda Spears, female   DOB: 16-Oct-1975, 41 y.o.   MRN: [id number]  Reason for Consult: New Patient (Initial Visit) (bilateral varicose veins left leg worse)   Referred by Marcine MatarJohnson, Deborah B, MD  Subjective:     HPI:  Melinda Spears is a 41 y.o. female without significant vascular disease and never been a smoker. She presents for evaluation of bilateral lower extremity varicose veins left greater than right. She says that they have been there for many years but gradually getting worse. She is never had a DVT. She has had several babies and this did worsen her varicose veins at that time. She does not appear to have ever had skin pain in them to suggest superficial thrombophlebitis or bleeding from the varicose veins. She is concerned with the cosmetic appearance. She does not have any leg swelling. She has no other complaints related to today's visit.  Past Medical History:  Diagnosis Date  . Acute meniscal tear, lateral    right leg  . Blood transfusion 2009   s/p kidney surgery  . Hx successful VBAC (vaginal birth after cesarean), currently pregnant   . Kidney calculus    right staghorn  . Kidney stone 2009  . Knee effusion, right    joint  . Pain, dental   . Varicose veins of both lower extremities    Family History  Problem Relation Age of Onset  . Diabetes Father   . Coronary artery disease Father [id number]  . Other Neg Hx    Past Surgical History:  Procedure Laterality Date  . APPENDECTOMY    . CESAREAN SECTION    . KIDNEY STONE SURGERY  02/20/2007   repeat pcnl for remaining right kidney stone fragments  . KIDNEY STONE SURGERY  03/19/2007   for futher stone fragment removal  . KIDNEY SURGERY  2009  . NEPHROLITHOTOMY  12/21/2006   percutaneous nephrolithotomy of right staghorn calculus- carbonate apatite    Short Social History:  Social History  Substance Use Topics  . Smoking status: Never Smoker  . Smokeless tobacco: Never Used  . Alcohol  use No    No Known Allergies  Current Outpatient Prescriptions  Medication Sig Dispense Refill  . traMADol (ULTRAM) 50 MG tablet Take 50 mg by mouth.     No current facility-administered medications for this visit.     Review of Systems  Constitutional:  Constitutional negative. HENT: HENT negative.  Eyes: Eyes negative.  Respiratory: Respiratory negative.  Cardiovascular: Cardiovascular negative.  GI: Gastrointestinal negative.  Musculoskeletal: Musculoskeletal negative.  Skin: Skin negative.  Neurological: Neurological negative. Hematologic: Hematologic/lymphatic negative.  Psychiatric: Psychiatric negative.        Objective:  Objective   Vitals:   03/09/17 1057  BP: (!) 95/50  Pulse: 61  Resp: 14  Temp: (!) 97.5 F (36.4 C)  SpO2: 100%  Weight: 149 lb (67.6 kg)  Height: 5\' 5"  (1.651 m)   Body mass index is 24.79 kg/m.  Physical Exam  Constitutional: She appears well-developed.  Eyes: Pupils are equal, round, and reactive to light.  Neck: Normal range of motion. Neck supple.  Cardiovascular: Normal rate.   Pulses:      Radial pulses are 2+ on the right side, and 2+ on the left side.       Dorsalis pedis pulses are 2+ on the right side, and 2+ on the left side.       Posterior tibial pulses are 2+ on the right  side, and 2+ on the left side.  Pulmonary/Chest: Effort normal.  Abdominal: Soft. She exhibits no mass.  Musculoskeletal: Normal range of motion. She exhibits no edema.  Varicose veins notable left anterior and lateral leg  Neurological: She is alert.  Skin: Skin is warm and dry.  Psychiatric: She has a normal mood and affect. Her behavior is normal. Judgment and thought content normal.    Data: I have independently interpreted her lower extremity venous duplex exam which demonstrates reflux in her bilateral greater saphenous veins. The right side has a diameter of 0.29 at the distal thigh 0.17 at the proximal calf. The left side is up to 0.79 at  the distal thigh but there is no reflux in the saphenofemoral junction. There is a focal area of superficial vein thrombosis in the right proximal calf. This bilateral small saphenous veins are competent.     Assessment/Plan:     41 year old female here for evaluation of varicose veins. She does not have the size or significant enough reflux for intervention of her greater saphenous veins it does not appear. She does have concern over the appearance of the varicosities on her left leg. I discussed with her using knee-high compression stockings for some of this and that she could have injection therapy. She has been given Liz's card and she will call to discuss and will need use of an interpreter. Reassured her that these are not dangerous if she has significant pain and she continued warm compresses and anti-inflammatory medications.     Maeola Harman MD [facility]

## 2018-03-13 ENCOUNTER — Ambulatory Visit: Payer: Self-pay | Attending: Internal Medicine

## 2018-04-02 ENCOUNTER — Ambulatory Visit: Payer: Self-pay | Attending: Internal Medicine | Admitting: Family Medicine

## 2018-04-02 ENCOUNTER — Encounter: Payer: Self-pay | Admitting: Family Medicine

## 2018-04-02 VITALS — BP 110/72 | HR 59 | Temp 98.2°F | Resp 17 | Ht 65.0 in | Wt 148.8 lb

## 2018-04-02 DIAGNOSIS — G8929 Other chronic pain: Secondary | ICD-10-CM

## 2018-04-02 DIAGNOSIS — I83813 Varicose veins of bilateral lower extremities with pain: Secondary | ICD-10-CM

## 2018-04-02 DIAGNOSIS — I8393 Asymptomatic varicose veins of bilateral lower extremities: Secondary | ICD-10-CM | POA: Insufficient documentation

## 2018-04-02 DIAGNOSIS — Z833 Family history of diabetes mellitus: Secondary | ICD-10-CM | POA: Insufficient documentation

## 2018-04-02 DIAGNOSIS — Z9889 Other specified postprocedural states: Secondary | ICD-10-CM | POA: Insufficient documentation

## 2018-04-02 DIAGNOSIS — Z8249 Family history of ischemic heart disease and other diseases of the circulatory system: Secondary | ICD-10-CM | POA: Insufficient documentation

## 2018-04-02 DIAGNOSIS — M25562 Pain in left knee: Secondary | ICD-10-CM

## 2018-04-02 MED ORDER — IBUPROFEN 600 MG PO TABS: 600.0000 mg | ORAL_TABLET | Freq: Three times a day (TID) | ORAL | 3 refills | Status: DC | PRN

## 2018-04-02 MED FILL — IBUPROFEN 600 MG TABLET: 600 | 20 days supply | Qty: 60 | Fill #0

## 2018-04-02 NOTE — Patient Instructions (Signed)
Venas varicosas (Varicose Veins) Las venas varicosas son venas que se han agrandado y tornado sinuosas. Suelen aparecer Cox Communicationsen las piernas, pero tambin pueden verse en otra parte del cuerpo. CAUSAS Esta afeccin se presenta como consecuencia del mal funcionamiento de las vlvulas de las venas, las cuales ayudan al retorno de la sangre desde las piernas hacia el corazn. Si estas vlvulas se daan, la sangre retrocede y regresa a las venas de la pierna, cerca de la superficie de la piel, lo que causa dilatacin venosa. FACTORES DE RIESGO Las personas que estn mucho tiempo paradas, las embarazadas o las personas con sobrepeso tienen ms probabilidades de tener venas varicosas. SIGNOS Y SNTOMAS  Venas abultadas, azuladas y de aspecto sinuoso que se observan con mayor frecuencia en las piernas.  Dolor o sensacin de Development worker, communitypesadez en las piernas. Estos sntomas pueden empeorar al final del da.  Hinchazn de las piernas.  Cambios en el color de la piel.  DIAGNSTICO Generalmente, el mdico puede diagnosticar las venas varicosas al examinarle las piernas. Adems, puede recomendarle que se haga una ecografa de las venas de las piernas. TRATAMIENTO La mayora de las venas varicosas pueden tratarse en casa. Sin embargo, hay otros tratamientos a disposicin de McGraw-Hilllas personas que tienen sntomas persistentes o desean mejorar la apariencia esttica de las venas varicosas. Estas opciones de tratamiento incluyen lo siguiente:  Escleroterapia. Se inyecta una solucin en la vena para anularla.  Tratamiento con lser. Se [place] un rayo lser para calentar la vena y anularla.  Ablacin venosa por radiofrecuencia Se [place]a una corriente elctrica que se produce mediante ondas de radio para anular la vena.  Flebectoma. Se extirpa quirrgicamente la vena a travs de pequeas incisiones que se hacen sobre la vena varicosa.  Ligadura venosa y varicectoma. La vena se extirpa quirrgicamente a travs de incisiones que se  realizan sobre la vena varicosa despus de haberla anudado (ligado). INSTRUCCIONES PARA EL CUIDADO EN EL HOGAR  No permanezca sentado o de pie en una posicin durante mucho tiempo. No se siente con las piernas cruzadas. Descanse con las piernas Radiation protection practitionerelevadas durante el da.  Use medias de compresin como le haya indicado su mdico. Estas medias ayudan a evitar la formacin de cogulos sanguneos y a Building services engineerreducir la hinchazn de las piernas.  No use otras prendas que le ajusten todo el contorno de las piernas, la pelvis o la cintura.  Camine todo lo posible para aumentar la circulacin de Risk managerla sangre.  A la noche, eleve el pie de la cama con bloques de 2pulgadas.  Si tiene un corte en la piel sobre la vena y la vena sangra, recustese con la pierna elevada y Colombiaejerza presin en el lugar con un pao limpio, hasta que deje de Geophysicist/field seismologistsangrar. Luego aplique un apsito (vendaje) sobre el corte. Consulte al mdico si el sangrado contina.  SOLICITE ATENCIN MDICA SI:  La piel alrededor del tobillo empieza a Lobbyistagrietarse.  Siente dolor, hay enrojecimiento, sensibilidad o hinchazn dura en la pierna sobre una vena.  Est incmodo debido al dolor de la pierna.  Esta informacin no tiene Theme park managercomo fin reemplazar el consejo del mdico. Asegrese de hacerle al mdico cualquier pregunta que tenga. Document Released: 04/05/2005 Document Revised: 10/18/2015 Document Reviewed: 12/28/2015 Elsevier Interactive Patient Education  2017 Elsevier Inc.  Venous Ulcer A venous ulcer is a shallow sore on your lower leg. It is caused by poor circulation in your veins. Venous ulcer is the most common type of lower leg ulcer. You may have venous ulcers on  one leg or on both legs. This condition most often develops around your ankles. This type of ulcer may last for a long time (chronic ulcer) or it may return often (recurrent ulcer). Follow these instructions at home: Wound care  Follow instructions from your doctor about: ? How to take  care of your wound. ? When and how you should change your bandage (dressing). ? When you should remove your bandage. If your bandage is dry and gets stuck to your leg when you try to remove it, moisten or wet the bandage with saline solution or water. This helps you to remove it without harming your skin or wound.  Check your wound every day for signs of infection. Have a caregiver do this for you if you are not able to do it yourself. Watch for: ? More redness, swelling, or pain. ? More fluid or blood. ? Pus, warmth, or a bad smell. Medicines  Take over-the-counter and prescription medicines only as told by your doctor.  If you were prescribed an antibiotic medicine, take it or apply it as told by your doctor. Do not stop taking or using the antibiotic even if your condition improves. Activity  Do not stand or sit in one position for a long period of time. Rest with your legs raised during the day. If possible, keep your legs above your heart for 30 minutes, 3-4 times a day, or as told by your doctor.  Do not sit with your legs crossed.  Walk often to increase the blood flow in your legs. Ask your doctor what level of activity is safe for you.  If you are taking a long ride in a car or plane, take a break to walk around at least once every two hours, or as told by your doctor. Ask your doctor if you should take aspirin before long trips. General instructions   Wear elastic stockings, compression stockings, or support hose as told by your doctor. This is very important.  Raise the foot of your bed as told by your doctor.  Do not smoke.  Keep all follow-up visits as told by your doctor. This is important. Contact a doctor if:  You have a fever.  Your ulcer is getting larger or is not healing.  Your pain gets worse.  You have more redness or swelling around your ulcer.  You have more fluid, blood, or pus coming from your ulcer after it has been cleaned by you or your  doctor.  You have warmth or a bad smell coming from your ulcer. This information is not intended to replace advice given to you by your health care provider. Make sure you discuss any questions you have with your health care provider. Document Released: 08/03/2004 Document Revised: 12/02/2015 Document Reviewed: 11/04/2014 Elsevier Interactive Patient Education  [employer].

## 2018-04-04 ENCOUNTER — Encounter: Payer: Self-pay | Admitting: Family Medicine

## 2018-04-04 NOTE — Progress Notes (Signed)
Subjective:    Patient ID: Melinda Spears, female    DOB: [birth year], 42 y.o.   MRN: [id number]  HPI 42 year old female who was last seen in the office on 12/28/2016 who presents secondary to complaint of continued issues with painful varicose veins as well as continued/worsening left knee pain.  Patient has had prior injury to the knee and reports that she was told that she has abnormal cartilage in her knee.  Patient states that she occasionally gets a clicking noise in her knee with movement and sometimes has difficulty with pain as well as feeling as if she cannot fully straighten her knee if she has been sitting for a while and then tries to get up to walk.  Patient states the pain can sometimes be sharp but is usually a dull ache.  Pain can range from a 4-5 to a 10.  Patient is not currently taking any prescription medications for pain.      Patient also with complaint of worsening of varicose veins.  Patient states that the veins, especially on her left leg have become more prominent and are constantly painful.  Patient states that she always has a pain level of about 6 on a 0-to-10 scale due to the varicose veins in her legs.  Patient states that she did see a specialist in the past and would like a referral back to the specialist regarding her veins.  Patient states that she has been unable to wear the compression stockings that were recommended because they actually make her legs hurt. Past Medical History:  Diagnosis Date  . Acute meniscal tear, lateral    right leg  . Blood transfusion 2009   s/p kidney surgery  . Hx successful VBAC (vaginal birth after cesarean), currently pregnant   . Kidney calculus    right staghorn  . Kidney stone 2009  . Knee effusion, right    joint  . Pain, dental   . Varicose veins of both lower extremities    Past Surgical History:  Procedure Laterality Date  . APPENDECTOMY    . CESAREAN SECTION    . KIDNEY STONE SURGERY  02/20/2007   repeat  pcnl for remaining right kidney stone fragments  . KIDNEY STONE SURGERY  03/19/2007   for futher stone fragment removal  . KIDNEY SURGERY  2009  . NEPHROLITHOTOMY  12/21/2006   percutaneous nephrolithotomy of right staghorn calculus- carbonate apatite   Family History  Problem Relation Age of Onset  . Diabetes Father   . Coronary artery disease Father 92  . Other Neg Hx    Social History   Tobacco Use  . Smoking status: Never Smoker  . Smokeless tobacco: Never Used  Substance Use Topics  . Alcohol use: No  . Drug use: No  No Known Allergies    Review of Systems  Constitutional: Positive for fatigue. Negative for chills and fever.  Respiratory: Negative for cough and shortness of breath.   Cardiovascular: Positive for leg swelling. Negative for chest pain and palpitations.  Gastrointestinal: Negative for abdominal pain and nausea.  Genitourinary: Negative for dysuria and frequency.  Musculoskeletal: Positive for arthralgias, gait problem and joint swelling.  Neurological: Negative for dizziness and headaches.       Objective:   Physical Exam BP 110/72   Pulse (!) 59   Temp 98.2 F (36.8 C) (Oral)   Resp 17   Ht 5\' 5"  (1.651 m)   Wt 148 lb 12.8 oz (67.5 kg)  LMP 03/14/2018 (Exact Date)   SpO2 100%   BMI 24.76 kg/m  Vital signs and nurse's notes reviewed General-well-nourished, well-developed female in no acute distress Neck-supple, no lymphadenopathy, no thyromegaly Lungs-clear to auscultation Cardiovascular-regular rate and rhythm Abdomen-soft, nontender Back-no CVA tenderness Musculoskeletal- patient with bilateral left knee joint line tenderness which is greater laterally, no effusion Extremities- no edema of the lower extremities but patient does have prominent varicose veins on the left medial leg to mid calf.  Patient with complaint of discomfort with palpation in these areas        Assessment & Plan:  1. Chronic pain of left knee Patient reports  chronic left knee pain and on review of her medical records, patient has had prior MRI in June 2012 which did show lateral meniscus tears.  Patient is given a prescription for ibuprofen 600 mg to take every 8 hours as needed for pain and patient will be referred back to orthopedics for further evaluation and treatment - ibuprofen (ADVIL,MOTRIN) 600 MG tablet; Take 1 tablet (600 mg total) by mouth every 8 (eight) hours as needed for moderate pain. Take after eating  Dispense: 60 tablet; Refill: 3 - Ambulatory referral to Orthopedic Surgery  2. Varicose veins of bilateral lower extremities with pain Patient has had prior consultation with vascular surgery as well as lower extremity venous duplex testing which did show some abnormalities at that time but were not significant enough for intervention.  Patient with complaint of continued pain and she believes that her varicose veins have slightly worsened.  Patient will be referred back to vascular surgery for further evaluation and treatment.  Patient given prescription for ibuprofen to take as needed for pain.  Patient is encouraged to retry the use of compression hose/stockings in the meantime. - ibuprofen (ADVIL,MOTRIN) 600 MG tablet; Take 1 tablet (600 mg total) by mouth every 8 (eight) hours as needed for moderate pain. Take after eating  Dispense: 60 tablet; Refill: 3 - Ambulatory referral to Vascular Surgery  *Patient was offered influenza immunization which she declined at today's visit  An After Visit Summary was printed and given to the patient.  Return if symptoms worsen or fail to improve.

## 2018-04-15 ENCOUNTER — Ambulatory Visit (INDEPENDENT_AMBULATORY_CARE_PROVIDER_SITE_OTHER): Payer: Self-pay

## 2018-04-15 ENCOUNTER — Encounter (INDEPENDENT_AMBULATORY_CARE_PROVIDER_SITE_OTHER): Payer: Self-pay | Admitting: Orthopedic Surgery

## 2018-04-15 ENCOUNTER — Ambulatory Visit (INDEPENDENT_AMBULATORY_CARE_PROVIDER_SITE_OTHER): Payer: Self-pay | Admitting: Orthopedic Surgery

## 2018-04-15 DIAGNOSIS — G8929 Other chronic pain: Secondary | ICD-10-CM

## 2018-04-15 DIAGNOSIS — M25562 Pain in left knee: Secondary | ICD-10-CM

## 2018-04-15 DIAGNOSIS — M1712 Unilateral primary osteoarthritis, left knee: Secondary | ICD-10-CM

## 2018-04-15 NOTE — Progress Notes (Signed)
Office Visit Note   Patient: Melinda Spears           Date of Birth: [birth year]           MRN: [id number] Visit Date: 04/15/2018 Requested by: Cain Saupe, MD [address] [place], [place] [address] PCP: Marcine Matar, MD  Subjective: Chief Complaint  Patient presents with  . Left Knee - Pain    HPI: Patient presents with left knee pain.  She reports some swelling and giving way and pain which is been going on for 5 years.  Denies any history of injury.  Occasionally will wake from sleep at night with the pain.  She works as a Educational psychologist.  No prior surgery in the left knee.  MRI scan in 2012 is reviewed of that left knee which showed some degenerative type lateral meniscal tearing with a small cyst at the joint line present.  Exercise helps her.              ROS: All systems reviewed are negative as they relate to the chief complaint within the history of present illness.  Patient denies  fevers or chills.   Assessment & Plan: Visit Diagnoses:  1. Chronic pain of left knee   2. Unilateral primary osteoarthritis, left knee     Plan: Impression is left knee pain with some evidence of progressive degenerative joint disease in that lateral compartment.  No effusion today.  Range of motion is excellent but she may be drifting into a little bit of valgus alignment.  I think arthroscopy and debridement would give her a 50-50 chance of improvement.  We discussed injections but she is had those in the past and does not want to proceed with that.  For now I think observation is indicated.  If she develops swelling or mechanical symptoms then arthroscopy would be a reasonable option.  I will see her back as needed  Follow-Up Instructions: Return if symptoms worsen or fail to improve.   Orders:  Orders Placed This Encounter  Procedures  . XR KNEE 3 VIEW LEFT   No orders of the defined types were placed in this encounter.     Procedures: No procedures  performed   Clinical Data: No additional findings.  Objective: Vital Signs: There were no vitals taken for this visit.  Physical Exam:   Constitutional: Patient appears well-developed HEENT:  Head: Normocephalic Eyes:EOM are normal Neck: Normal range of motion Cardiovascular: Normal rate Pulmonary/chest: Effort normal Neurologic: Patient is alert Skin: Skin is warm Psychiatric: Patient has normal mood and affect    Ortho Exam: Ortho exam demonstrates slight valgus alignment left lower extremity with palpable pedal pulses.  Range of motion is excellent muscle tone is good.  No effusion in either knee.  Collateral cruciate ligaments are stable in the left knee.  She does have a palpable Baker's cyst which is nontender in the back medial aspect of the left knee.  No groin pain with internal extra rotation of the leg.  Specialty Comments:  No specialty comments available.  Imaging: Xr Knee 3 View Left  Result Date: 04/15/2018 AP lateral merchant left knee reviewed.  Mild to moderate degenerative changes present in the lateral compartment.  Mild degenerative changes present in the medial compartment.  Patellofemoral joint is located.  No fracture dislocation is present.  Bone quality appears normal.    PMFS History: Patient Active Problem List   Diagnosis Date Noted  . Diabetes mellitus screening  12/28/2016  . ESBL (extended spectrum beta-lactamase) producing bacteria infection 01/10/2013  . Acute pyelonephritis 01/10/2013  . Gram negative septicemia (HCC) 12/03/2012  . Renal cyst 12/03/2012  . Adnexal cyst 12/03/2012  . Flank pain 12/02/2012  . Pyelonephritis 12/02/2012  . Sepsis (HCC) 12/02/2012  . Hypokalemia 12/02/2012   Past Medical History:  Diagnosis Date  . Acute meniscal tear, lateral    right leg  . Blood transfusion 2009   s/p kidney surgery  . Hx successful VBAC (vaginal birth after cesarean), currently pregnant   . Kidney calculus    right staghorn   . Kidney stone 2009  . Knee effusion, right    joint  . Pain, dental   . Varicose veins of both lower extremities     Family History  Problem Relation Age of Onset  . Diabetes Father   . Coronary artery disease Father 80  . Other Neg Hx     Past Surgical History:  Procedure Laterality Date  . APPENDECTOMY    . CESAREAN SECTION    . KIDNEY STONE SURGERY  02/20/2007   repeat pcnl for remaining right kidney stone fragments  . KIDNEY STONE SURGERY  03/19/2007   for futher stone fragment removal  . KIDNEY SURGERY  2009  . NEPHROLITHOTOMY  12/21/2006   percutaneous nephrolithotomy of right staghorn calculus- carbonate apatite   Social History   Occupational History  . Occupation: Cook at [employer]  . Smoking status: Never Smoker  . Smokeless tobacco: Never Used  Substance and Sexual Activity  . Alcohol use: No  . Drug use: No  . Sexual activity: Yes    Birth control/protection: None

## 2018-04-24 ENCOUNTER — Other Ambulatory Visit: Payer: Self-pay

## 2018-04-24 DIAGNOSIS — I83813 Varicose veins of bilateral lower extremities with pain: Secondary | ICD-10-CM

## 2018-06-14 ENCOUNTER — Ambulatory Visit (INDEPENDENT_AMBULATORY_CARE_PROVIDER_SITE_OTHER): Payer: Self-pay | Admitting: Vascular Surgery

## 2018-06-14 ENCOUNTER — Encounter: Payer: Self-pay | Admitting: Vascular Surgery

## 2018-06-14 ENCOUNTER — Ambulatory Visit (HOSPITAL_COMMUNITY)
Admission: RE | Admit: 2018-06-14 | Discharge: 2018-06-14 | Disposition: A | Discharge status: Home / Self Care | Payer: Self-pay | Source: Ambulatory Visit | Attending: Family | Admitting: Family

## 2018-06-14 ENCOUNTER — Other Ambulatory Visit: Payer: Self-pay

## 2018-06-14 VITALS — BP 110/52 | HR 65 | Temp 97.5°F | Resp 20 | Ht 65.0 in

## 2018-06-14 DIAGNOSIS — I83813 Varicose veins of bilateral lower extremities with pain: Secondary | ICD-10-CM | POA: Insufficient documentation

## 2018-06-14 DIAGNOSIS — I82723 Chronic embolism and thrombosis of deep veins of upper extremity, bilateral: Secondary | ICD-10-CM

## 2018-06-14 NOTE — Progress Notes (Signed)
Patient ID: [name], female   DOB: [birth year], 42 y.o.   MRN: [id number]  Reason for Consult: Varicose Veins   Referred by Marcine Matar, MD  Subjective:     HPI:  Emaan Gary is a 42 y.o. female without significant past medical history presents for evaluation of left lower extremity swelling pain and varicosities.  States that her leg feels heavy.  She does continue to work she is on her feet frequently.  She has had 3 babies states that they were worse after that.  She has never had a blood clot.  She has never had surgery on either leg.  She does not take any blood thinners.  She has not worn compression stockings.  She is quite active.  Past Medical History:  Diagnosis Date  . Acute meniscal tear, lateral    right leg  . Blood transfusion 2009   s/p kidney surgery  . Hx successful VBAC (vaginal birth after cesarean), currently pregnant   . Kidney calculus    right staghorn  . Kidney stone 2009  . Knee effusion, right    joint  . Pain, dental   . Varicose veins of both lower extremities    Family History  Problem Relation Age of Onset  . Diabetes Father   . Coronary artery disease Father 44  . Other Neg Hx    Past Surgical History:  Procedure Laterality Date  . APPENDECTOMY    . CESAREAN SECTION    . KIDNEY STONE SURGERY  02/20/2007   repeat pcnl for remaining right kidney stone fragments  . KIDNEY STONE SURGERY  03/19/2007   for futher stone fragment removal  . KIDNEY SURGERY  2009  . NEPHROLITHOTOMY  12/21/2006   percutaneous nephrolithotomy of right staghorn calculus- carbonate apatite    Short Social History:  Social History   Tobacco Use  . Smoking status: Never Smoker  . Smokeless tobacco: Never Used  Substance Use Topics  . Alcohol use: No    No Known Allergies  Current Outpatient Medications  Medication Sig Dispense Refill  . ibuprofen (ADVIL,MOTRIN) 600 MG tablet Take 1 tablet (600 mg total) by mouth every 8 (eight)  hours as needed for moderate pain. Take after eating 60 tablet 3   No current facility-administered medications for this visit.     Review of Systems  Constitutional:  Constitutional negative. HENT: HENT negative.  Eyes: Eyes negative.  Cardiovascular: Positive for leg swelling.  GI: Gastrointestinal negative.  Musculoskeletal: Positive for leg pain.  Skin: Skin negative.  Neurological: Neurological negative. Hematologic: Hematologic/lymphatic negative.  Psychiatric: Psychiatric negative.        Objective:  Objective   Vitals:   06/14/18 1602  BP: (!) 110/52  Pulse: 65  Resp: 20  Temp: (!) 97.5 F (36.4 C)  SpO2: 100%  Height: 5\' 5"  (1.651 m)   Body mass index is 24.76 kg/m.  Physical Exam  Constitutional: She is oriented to person, place, and time. She appears well-developed.  HENT:  Head: Normocephalic.  Eyes: Pupils are equal, round, and reactive to light.  Neck: Normal range of motion.  Cardiovascular:  Pulses:      Radial pulses are 2+ on the right side, and 2+ on the left side.       Dorsalis pedis pulses are 2+ on the right side, and 2+ on the left side.       Posterior tibial pulses are 2+ on the right side, and 2+ on  the left side.  Abdominal: Soft. She exhibits no mass.  Musculoskeletal: She exhibits edema.  Mild edema left leg, varicose veins left lateral leg  Neurological: She is alert and oriented to person, place, and time.  Skin: Capillary refill takes less than 2 seconds.  Psychiatric: She has a normal mood and affect. Her behavior is normal. Judgment and thought content normal.    Data: I have independently interpreted her greater saphenous vein reflux study which demonstrates reflux bilaterally on the right up to 2362 ms and on the left up to 3029 ms.  Greater saphenous vein on the right at the junction 0.77 cm on the left 0.68 cm measuring is much is 0.73 cm on the left at the distal thigh.     Assessment/Plan:     42 year old female with  C3 venous disease and varicosities in the left lower extremity with associated swelling mostly in that leg.  She does have heaviness as well.  I prescribed thigh-high compression stockings today which she will get from [place].  She will follow-up in 3 months for evaluation of venous ablation with possible stab phlebectomy.  She demonstrates good understanding via interpreter.     Maeola HarmanBrandon Christopher Mercer Stallworth MD [facility]

## 2018-09-16 ENCOUNTER — Ambulatory Visit: Payer: Self-pay | Attending: Internal Medicine

## 2018-09-19 ENCOUNTER — Ambulatory Visit (INDEPENDENT_AMBULATORY_CARE_PROVIDER_SITE_OTHER): Payer: Self-pay | Admitting: Vascular Surgery

## 2018-09-19 ENCOUNTER — Encounter: Payer: Self-pay | Admitting: Vascular Surgery

## 2018-09-19 ENCOUNTER — Other Ambulatory Visit: Payer: Self-pay

## 2018-09-19 VITALS — BP 113/69 | HR 61 | Temp 98.7°F | Resp 18 | Ht 65.0 in | Wt 150.0 lb

## 2018-09-19 DIAGNOSIS — I83813 Varicose veins of bilateral lower extremities with pain: Secondary | ICD-10-CM

## 2018-09-19 NOTE — Progress Notes (Signed)
Patient name: Melinda Spears MRN: [id number] DOB: [birth year] Sex: female  REASON FOR VISIT:   Follow-up of varicose veins.  HPI:  He was evaluated with left lower extremity swelling, pain and varicosities. Melinda Spears is a pleasant 43 y.o. female who was seen in consultation by Dr. Lemar Livings on 06/14/18.  Her chief complaint was left lower extremity swelling, pain and significant varicosities.  On exam she had mild edema of the left leg with varicose veins in the left lateral leg.  The venous duplex scan at that time showed reflux in both great saphenous veins.  She was instructed to wear thigh-high compression stockings with a gradient of 20-30, elevate her legs and take ibuprofen as needed for pain.  She returns for a 62-month follow-up visit.  She had CEAP C3 venous disease.  Since the patient was seen last.  She continues to have some aching pain and swelling in both lower extremities which is aggravated by standing and sitting and relieved somewhat with elevation.  She has been wearing her thigh-high compression stockings which do help.  Her symptoms are more significant on the left side.  All of the history is obtained through her translator.  She has no history of DVT.  Past Medical History:  Diagnosis Date  . Acute meniscal tear, lateral    right leg  . Blood transfusion 2009   s/p kidney surgery  . Hx successful VBAC (vaginal birth after cesarean), currently pregnant   . Kidney calculus    right staghorn  . Kidney stone 2009  . Knee effusion, right    joint  . Pain, dental   . Varicose veins of both lower extremities     Family History  Problem Relation Age of Onset  . Diabetes Father   . Coronary artery disease Father 17  . Other Neg Hx     SOCIAL HISTORY: Social History   Tobacco Use  . Smoking status: Never Smoker  . Smokeless tobacco: Never Used  Substance Use Topics  . Alcohol use: No    No Known Allergies  Current Outpatient  Medications  Medication Sig Dispense Refill  . ibuprofen (ADVIL,MOTRIN) 600 MG tablet Take 1 tablet (600 mg total) by mouth every 8 (eight) hours as needed for moderate pain. Take after eating 60 tablet 3   No current facility-administered medications for this visit.     REVIEW OF SYSTEMS:  [X]  denotes positive finding, [ ]  denotes negative finding Cardiac  Comments:  Chest pain or chest pressure:    Shortness of breath upon exertion:    Short of breath when lying flat:    Irregular heart rhythm:        Vascular    Pain in calf, thigh, or hip brought on by ambulation: x   Pain in feet at night that wakes you up from your sleep:  x   Blood clot in your veins:    Leg swelling:  x       Pulmonary    Oxygen at home:    Productive cough:     Wheezing:         Neurologic    Sudden weakness in arms or legs:     Sudden numbness in arms or legs:     Sudden onset of difficulty speaking or slurred speech:    Temporary loss of vision in one eye:     Problems with dizziness:         Gastrointestinal  Blood in stool:     Vomited blood:         Genitourinary    Burning when urinating:     Blood in urine:        Psychiatric    Major depression:         Hematologic    Bleeding problems:    Problems with blood clotting too easily:        Skin    Rashes or ulcers:        Constitutional    Fever or chills:     PHYSICAL EXAM:   Vitals:   09/19/18 1549  BP: 113/69  Pulse: 61  Resp: 18  Temp: 98.7 F (37.1 C)  TempSrc: Oral  SpO2: 100%  Weight: 150 lb (68 kg)  Height: 5\' 5"  (1.651 m)    GENERAL: The patient is a well-nourished female, in no acute distress. The vital signs are documented above. CARDIAC: There is a regular rate and rhythm.  VASCULAR: I do not detect carotid bruits. She has palpable pedal pulses. VENOUS EXAM: The patient does have some dilated varicose veins along the lateral aspect of her left leg.  Currently they are not under significant pressure.   She has been wearing her thigh-high compression stocking today and also has not had much to drink. I looked at her left great saphenous vein myself with the SonoSite.  Great saphenous vein in the thigh is not especially dilated. PULMONARY: There is good air exchange bilaterally without wheezing or rales. ABDOMEN: Soft and non-tender with normal pitched bowel sounds.  MUSCULOSKELETAL: There are no major deformities or cyanosis. NEUROLOGIC: No focal weakness or paresthesias are detected. SKIN: There are no ulcers or rashes noted. PSYCHIATRIC: The patient has a normal affect.  DATA:    No new data  MEDICAL ISSUES:   PAINFUL VARICOSE VEINS BILATERAL LOWER EXTREMITIES: This patient does have significant reflux in the left great saphenous vein and varicose veins however her symptoms have improved somewhat.  By my exam of her great saphenous vein with the SonoSite the vein today is not especially dilated.  This may be related to the fact that she has been wearing her thigh-high compression stocking all day and is also somewhat dehydrated.  I have explained that although she continues to have symptoms I would be reluctant to consider laser ablation of the left great saphenous vein as it may technically be difficult given that the vein appears small on my exam today.  I have discussed with her the importance of intermittent leg elevation the proper positioning for this.  I encouraged her to continue to wear her compression stockings.  I have encouraged her to try to avoid prolonged sitting and standing.  We discussed the importance of exercise.  I plan on seeing her back in 6 months.  If on exam then the vein appears large and she is having persistent symptoms and I think she would be a good candidate for laser ablation of the left great saphenous vein and 10-20 stab phlebectomies.  If her symptoms are not improving we will continue with conservative treatment.  If her symptoms worsen before 6 months she will  call and I will be happy to see her sooner.  A total of 35 minutes was spent on this visit. 15 minutes was face to face time. More than 50% of the time was spent on counseling and coordinating with the patient.    Waverly Ferrari [facility]  [phone] 

## 2019-01-15 ENCOUNTER — Other Ambulatory Visit (HOSPITAL_COMMUNITY): Payer: Self-pay | Admitting: *Deleted

## 2019-01-15 DIAGNOSIS — Z1231 Encounter for screening mammogram for malignant neoplasm of breast: Secondary | ICD-10-CM

## 2019-04-03 ENCOUNTER — Encounter (HOSPITAL_COMMUNITY): Payer: Self-pay

## 2019-04-03 ENCOUNTER — Ambulatory Visit
Admission: RE | Admit: 2019-04-03 | Discharge: 2019-04-03 | Disposition: A | Discharge status: Home / Self Care | Payer: No Typology Code available for payment source | Source: Ambulatory Visit | Attending: Obstetrics and Gynecology | Admitting: Obstetrics and Gynecology

## 2019-04-03 ENCOUNTER — Other Ambulatory Visit: Payer: Self-pay

## 2019-04-03 ENCOUNTER — Ambulatory Visit (HOSPITAL_COMMUNITY)
Admission: RE | Admit: 2019-04-03 | Discharge: 2019-04-03 | Disposition: A | Discharge status: Home / Self Care | Payer: Self-pay | Source: Ambulatory Visit | Attending: Obstetrics and Gynecology | Admitting: Obstetrics and Gynecology

## 2019-04-03 DIAGNOSIS — Z1239 Encounter for other screening for malignant neoplasm of breast: Secondary | ICD-10-CM | POA: Insufficient documentation

## 2019-04-03 DIAGNOSIS — Z1231 Encounter for screening mammogram for malignant neoplasm of breast: Secondary | ICD-10-CM

## 2019-04-03 NOTE — Patient Instructions (Addendum)
Explained breast self awareness with Kandis Mannan Bustos. Unable to complete patients Pap smear today due to patient is currently on her menstrual period. Patient scheduled to come to the free cervical cancer screening for a Pap smear on Monday, May 05, 2019 at 1425. Let her know [facility] will cover Pap smears every 3 years unless has a history of abnormal Pap smears. Referred patient to the [facility] for a screening mammogram. Appointment scheduled for Thursday, April 03, 2019 at 1540. Patient aware of appointments and will be there. Let patient know the  [facility] will follow up with her within the next couple weeks with results of her mammogram by letter or phone. [place] verbalized understanding.  Thea Holshouser, Arvil Chaco, RN 2:59 PM

## 2019-04-03 NOTE — Progress Notes (Signed)
No complaints today.   Pap Smear: Pap smear not completed today. Last Pap smear was in September 2017 at the [facility] and normal per patient. Unable to complete patients Pap smear today due to patient is currently on her menstrual period. Patient scheduled to come to the free cervical cancer screening for a Pap smear on Monday, May 05, 2019 at 1425. Per patient has no history of an abnormal Pap smear. No Pap smear results are in Epic.  Physical exam: Breasts Breasts symmetrical. No skin abnormalities bilateral breasts. No nipple retraction bilateral breasts. No nipple discharge bilateral breasts. No lymphadenopathy. No lumps palpated bilateral breasts. No complaints of pain or tenderness on exam. Referred patient to the [facility] for a screening mammogram. Appointment scheduled for Thursday, April 03, 2019 at 1540.        Pelvic/Bimanual No Pap smear completed today since patient is currently on her menstrual period.  Smoking History: Patient has never smoked.  Patient Navigation: Patient education provided. Access to services provided for patient through [facility] program. Spanish interpreter provided.   Breast and Cervical Cancer Risk Assessment: Patient has no family history of breast cancer, known genetic mutations, or radiation treatment to the chest before age 52. Patient has no history of cervical dysplasia, immunocompromised, or DES exposure in-utero.  Risk Assessment    Risk Scores      04/03/2019   Last edited by: Armond Hang, LPN   5-year risk: 0.3 %   Lifetime risk: 5.1 %         Used Spanish interpreter Rudene Anda from [place].

## 2019-05-05 ENCOUNTER — Other Ambulatory Visit: Payer: Self-pay | Admitting: *Deleted

## 2019-05-05 ENCOUNTER — Other Ambulatory Visit: Payer: Self-pay

## 2019-05-05 DIAGNOSIS — Z124 Encounter for screening for malignant neoplasm of cervix: Secondary | ICD-10-CM | POA: Insufficient documentation

## 2019-05-05 NOTE — Progress Notes (Signed)
Patient: Melinda Spears           Date of Birth: 1975/07/28           MRN: [id number] Visit Date: 05/05/2019 PCP: Ladell Pier, MD  Temp: 97.5 Temporal  Cervical Cancer Screening Do you smoke?: No Have you ever had or been told you have an allergy to latex products?: No Marital status: Single Date of last pap smear: 2-5 yrs ago Date of last menstrual period: 04/28/19 Number of pregnancies: 4 Number of births: 3 Have you ever had any of the following? Hysterectomy: No Tubal ligation (tubes tied): No Abnormal bleeding: No Abnormal pap smear: No Venereal warts: No A sex partner with venereal warts: No A high risk* sex partner: No  Cervical Exam Normal Exam. Scant amount of brownish colored blood observed at cervical os consistent with the end of patients menstrual period. If today's Pap smear is normal next Pap smear is due in three years.  Patient's History Patient Active Problem List   Diagnosis Date Noted  . Pap smear for cervical cancer screening 05/05/2019  . Screening breast examination 04/03/2019  . Diabetes mellitus screening 12/28/2016  . ESBL (extended spectrum beta-lactamase) producing bacteria infection 01/10/2013  . Acute pyelonephritis 01/10/2013  . Gram negative septicemia ([place]) 12/03/2012  . Renal cyst 12/03/2012  . Adnexal cyst 12/03/2012  . Flank pain 12/02/2012  . Pyelonephritis 12/02/2012  . Sepsis ([place]) 12/02/2012  . Hypokalemia 12/02/2012   Past Medical History:  Diagnosis Date  . Acute meniscal tear, lateral    right leg  . Blood transfusion 2009   s/p kidney surgery  . Hx successful VBAC (vaginal birth after cesarean), currently pregnant   . Kidney calculus    right staghorn  . Kidney stone 2009  . Knee effusion, right    joint  . Pain, dental   . Varicose veins of both lower extremities     Family History  Problem Relation Age of Onset  . Diabetes Father   . Other Neg Hx   . Breast cancer Neg Hx     Social History    Occupational History  . Occupation: Cook at [employer]  . Smoking status: Never Smoker  . Smokeless tobacco: Never Used  Substance and Sexual Activity  . Alcohol use: No  . Drug use: No  . Sexual activity: Yes    Birth control/protection: None   Used Spanish interpreter Rudene Anda from [place].

## 2019-05-07 LAB — CYTOLOGY - PAP: Diagnosis: NEGATIVE

## 2019-07-24 ENCOUNTER — Encounter (HOSPITAL_COMMUNITY): Payer: Self-pay

## 2019-11-27 ENCOUNTER — Other Ambulatory Visit: Payer: Self-pay

## 2019-11-27 ENCOUNTER — Emergency Department (HOSPITAL_COMMUNITY)
Admission: EM | Admit: 2019-11-27 | Discharge: 2019-11-28 | Disposition: A | Discharge status: Home / Self Care | Payer: No Typology Code available for payment source | Attending: Emergency Medicine | Admitting: Emergency Medicine

## 2019-11-27 DIAGNOSIS — L299 Pruritus, unspecified: Secondary | ICD-10-CM | POA: Insufficient documentation

## 2019-11-27 DIAGNOSIS — L509 Urticaria, unspecified: Secondary | ICD-10-CM | POA: Insufficient documentation

## 2019-11-27 MED ORDER — FAMOTIDINE 20 MG PO TABS
20.0000 mg | ORAL_TABLET | Freq: Once | ORAL | Status: AC
  Administered 2019-11-27: 20 mg via ORAL
  Filled 2019-11-27: qty 1

## 2019-11-27 MED ORDER — PREDNISONE 20 MG PO TABS
60.0000 mg | ORAL_TABLET | Freq: Once | ORAL | Status: AC
  Administered 2019-11-27: 60 mg via ORAL
  Filled 2019-11-27: qty 3

## 2019-11-27 MED ORDER — DIPHENHYDRAMINE HCL 25 MG PO CAPS
50.0000 mg | ORAL_CAPSULE | Freq: Once | ORAL | Status: AC
  Administered 2019-11-27: 50 mg via ORAL
  Filled 2019-11-27: qty 2

## 2019-11-27 NOTE — Discharge Instructions (Addendum)
Thank you for allowing me to care for you today. Please return to the emergency department if you have new or worsening symptoms. Take your medications as instructed.  ° °

## 2019-11-27 NOTE — ED Triage Notes (Signed)
Patient arrives via POV with c/o of new onset rash that started today. Patient states that rash is all over (back/legs/arms) and itches severely. Patient went to [facility] and they gave her Hydrocortisone cream with some relief, however, her hives returned.   Patient is primarily spanish speaking.

## 2019-11-27 NOTE — ED Provider Notes (Signed)
[facility] EMERGENCY DEPARTMENT Provider Note   CSN: [id number] Arrival date & time: 11/27/19  2012     History Chief Complaint  Patient presents with  . Rash  . Itching    Melinda Spears is a 44 y.o. female.  Patient is a 44 year old female with noncontributory past medical history presenting to the emergency department for hives.  Reports that this started this morning.  There improved some with hydrocortisone cream but then returned.  She does report that she has been using a new soap since yesterday.  Denies any trouble breathing or swallowing, nausea or vomiting or abdominal pain, swelling of the tongue, lips, face or throat, itching in the mouth.        Past Medical History:  Diagnosis Date  . Acute meniscal tear, lateral    right leg  . Blood transfusion 2009   s/p kidney surgery  . Hx successful VBAC (vaginal birth after cesarean), currently pregnant   . Kidney calculus    right staghorn  . Kidney stone 2009  . Knee effusion, right    joint  . Pain, dental   . Varicose veins of both lower extremities     Patient Active Problem List   Diagnosis Date Noted  . Pap smear for cervical cancer screening 05/05/2019  . Screening breast examination 04/03/2019  . Diabetes mellitus screening 12/28/2016  . ESBL (extended spectrum beta-lactamase) producing bacteria infection 01/10/2013  . Acute pyelonephritis 01/10/2013  . Gram negative septicemia (HCC) 12/03/2012  . Renal cyst 12/03/2012  . Adnexal cyst 12/03/2012  . Flank pain 12/02/2012  . Pyelonephritis 12/02/2012  . Sepsis (HCC) 12/02/2012  . Hypokalemia 12/02/2012    Past Surgical History:  Procedure Laterality Date  . APPENDECTOMY    . CESAREAN SECTION    . KIDNEY STONE SURGERY  02/20/2007   repeat pcnl for remaining right kidney stone fragments  . KIDNEY STONE SURGERY  03/19/2007   for futher stone fragment removal  . KIDNEY SURGERY  2009  . NEPHROLITHOTOMY  12/21/2006   percutaneous nephrolithotomy of right staghorn calculus- carbonate apatite     OB History    Gravida  4   Para  3   Term  2   Preterm  1   AB  1   Living  3     SAB  1   TAB  0   Ectopic  0   Multiple  0   Live Births  3           Family History  Problem Relation Age of Onset  . Diabetes Father   . Other Neg Hx   . Breast cancer Neg Hx     Social History   Tobacco Use  . Smoking status: Never Smoker  . Smokeless tobacco: Never Used  Substance Use Topics  . Alcohol use: No  . Drug use: No    Home Medications Prior to Admission medications   Medication Sig Start Date End Date Taking? Authorizing Provider  ibuprofen (ADVIL,MOTRIN) 600 MG tablet Take 1 tablet (600 mg total) by mouth every 8 (eight) hours as needed for moderate pain. Take after eating Patient not taking: Reported on 04/03/2019 04/02/18   Fulp, Hewitt Shorts, MD  predniSONE (DELTASONE) 10 MG tablet Take 4 tablets (40 mg total) by mouth daily for 5 days. 11/27/19 12/02/19  Arlyn Dunning, PA-C    Allergies    Patient has no known allergies.  Review of Systems   Review of  Systems  Constitutional: Negative for appetite change, chills and fever.  HENT: Negative for congestion.   Respiratory: Negative for cough, shortness of breath, wheezing and stridor.   Cardiovascular: Negative for leg swelling.  Gastrointestinal: Negative for abdominal pain, nausea and vomiting.  Genitourinary: Negative for dysuria.  Musculoskeletal: Negative for arthralgias.  Skin: Positive for rash.  Neurological: Negative for dizziness and headaches.    Physical Exam Updated Vital Signs BP 126/81 (BP Location: Right Arm)   Pulse 71   Temp 97.8 F (36.6 C) (Oral)   Resp 16   Ht 5\' 5"  (1.651 m)   Wt 68 kg   SpO2 100%   BMI 24.96 kg/m   Physical Exam Vitals and nursing note reviewed.  Constitutional:      General: She is not in acute distress.    Appearance: Normal appearance. She is not ill-appearing,  toxic-appearing or diaphoretic.  HENT:     Head: Normocephalic.     Mouth/Throat:     Mouth: Mucous membranes are moist.     Pharynx: No oropharyngeal exudate or posterior oropharyngeal erythema.     Comments: Airway is patent Eyes:     Conjunctiva/sclera: Conjunctivae normal.  Cardiovascular:     Rate and Rhythm: Normal rate.     Pulses: Normal pulses.  Pulmonary:     Effort: Pulmonary effort is normal.     Breath sounds: Normal breath sounds.  Abdominal:     General: Abdomen is flat.     Palpations: Abdomen is soft.  Skin:    General: Skin is dry.     Comments: Scattered hives over extremities and torso and face  Neurological:     Mental Status: She is alert.  Psychiatric:        Mood and Affect: Mood normal.     ED Results / Procedures / Treatments   Labs (all labs ordered are listed, but only abnormal results are displayed) Labs Reviewed - No data to display  EKG None  Radiology No results found.  Procedures Procedures (including critical care time)  Medications Ordered in ED Medications  diphenhydrAMINE (BENADRYL) capsule 50 mg (50 mg Oral Given 11/27/19 2335)  predniSONE (DELTASONE) tablet 60 mg (60 mg Oral Given 11/27/19 2335)  famotidine (PEPCID) tablet 20 mg (20 mg Oral Given 11/27/19 2335)    ED Course  I have reviewed the triage vital signs and the nursing notes.  Pertinent labs & imaging results that were available during my care of the patient were reviewed by me and considered in my medical decision making (see chart for details).  Clinical Course as of Nov 27 1  Thu Nov 27, 2019  2342 Patient with bradycardia.  Possibly due to new soap.  No signs of anaphylaxis.  Given prednisone, Benadryl and Pepcid and advised to avoid any use of that soap.  Follow-up with primary care doctor.  Advised on return precautions.   [KM]    Clinical Course User Index [KM] 2343   MDM Rules/Calculators/A&P                      Based on review  of vitals, medical screening exam, lab work and/or imaging, there does not appear to be an acute, emergent etiology for the patient's symptoms. Counseled pt on good return precautions and encouraged both PCP and ED follow-up as needed.  Prior to discharge, I also discussed incidental imaging findings with patient in detail and advised appropriate, recommended follow-up in detail.  Clinical Impression: 1. Urticaria     Disposition: Discharge  Prior to providing a prescription for a controlled substance, I independently reviewed the patient's recent prescription history on the [place]. The patient had no recent or regular prescriptions and was deemed appropriate for a brief, less than 3 day prescription of narcotic for acute analgesia.  This note was prepared with assistance of Systems analyst. Occasional wrong-word or sound-a-like substitutions may have occurred due to the inherent limitations of voice recognition software.  Final Clinical Impression(s) / ED Diagnoses Final diagnoses:  Urticaria    Rx / DC Orders ED Discharge Orders         Ordered    predniSONE (DELTASONE) 10 MG tablet  Daily     11/28/19 0003           Melinda Spears 11/28/19 0003    Palumbo, April, MD 11/28/19 0009

## 2019-11-28 MED ORDER — PREDNISONE 10 MG PO TABS: 40.0000 mg | ORAL_TABLET | Freq: Every day | ORAL | 0 refills | Status: AC

## 2019-11-28 MED FILL — predniSONE 10 MG TABS: 10 | 5 days supply | Qty: 20 | Fill #0

## 2019-12-02 ENCOUNTER — Ambulatory Visit: Payer: No Typology Code available for payment source

## 2020-01-23 ENCOUNTER — Telehealth: Payer: Self-pay | Admitting: General Practice

## 2020-01-23 NOTE — Telephone Encounter (Signed)
Attempted to call patient with interpreter ID # [id number]. Unable to leave a vm due to vm not being set up. Interpreter attempted 2xs.   Copied from CRM [id number]. Topic: General - Other >> Jan 23, 2020 12:33 PM [name] wrote: Reason for CRM: PT need an appt orange Card renewal / please advise

## 2020-03-17 ENCOUNTER — Ambulatory Visit: Payer: No Typology Code available for payment source | Admitting: Family Medicine

## 2020-04-19 ENCOUNTER — Ambulatory Visit: Payer: No Typology Code available for payment source | Admitting: Family Medicine

## 2020-04-30 ENCOUNTER — Ambulatory Visit: Payer: No Typology Code available for payment source | Admitting: Family Medicine

## 2020-05-05 ENCOUNTER — Ambulatory Visit: Payer: Self-pay | Attending: Family Medicine | Admitting: Family Medicine

## 2020-05-05 ENCOUNTER — Other Ambulatory Visit: Payer: Self-pay

## 2020-05-05 ENCOUNTER — Encounter: Payer: Self-pay | Admitting: Family Medicine

## 2020-05-05 ENCOUNTER — Ambulatory Visit: Payer: No Typology Code available for payment source | Admitting: Pharmacist

## 2020-05-05 ENCOUNTER — Other Ambulatory Visit: Payer: Self-pay | Admitting: Family Medicine

## 2020-05-05 VITALS — BP 103/60 | HR 75 | Ht 65.0 in | Wt 156.0 lb

## 2020-05-05 DIAGNOSIS — Z131 Encounter for screening for diabetes mellitus: Secondary | ICD-10-CM

## 2020-05-05 DIAGNOSIS — M170 Bilateral primary osteoarthritis of knee: Secondary | ICD-10-CM

## 2020-05-05 DIAGNOSIS — G5603 Carpal tunnel syndrome, bilateral upper limbs: Secondary | ICD-10-CM

## 2020-05-05 LAB — POCT GLYCOSYLATED HEMOGLOBIN (HGB A1C): HbA1c, POC (controlled diabetic range): 5.4 % (ref 0.0–7.0)

## 2020-05-05 MED ORDER — GABAPENTIN 300 MG PO CAPS: 300.0000 mg | ORAL_CAPSULE | Freq: Every day | ORAL | 3 refills | Status: DC

## 2020-05-05 MED ORDER — MELOXICAM 7.5 MG PO TABS: 7.5000 mg | ORAL_TABLET | Freq: Every day | ORAL | 3 refills | Status: DC

## 2020-05-05 MED FILL — GABAPENTIN 300 MG CAPSULE: 300 | 30 days supply | Qty: 30 | Fill #0

## 2020-05-05 MED FILL — MELOXICAM 7.5 MG TABLET: 7.5 | 30 days supply | Qty: 30 | Fill #0

## 2020-05-05 NOTE — Patient Instructions (Signed)
Sndrome del tnel carpiano Carpal Tunnel Syndrome  El sndrome del tnel carpiano es una afeccin que causa dolor en la mano y en el brazo. El tnel carpiano es un rea estrecha que se encuentra en el lado palmar de la mueca. Los movimientos repetidos de la mueca o determinadas enfermedades pueden causar hinchazn en el tnel. Esta hinchazn puede comprimir el nervio principal de la mueca (nervio mediano). Cules son las causas? Esta afeccin puede ser causada por lo siguiente:  Movimientos repetidos de la mueca.  Lesiones en la mueca.  Artritis.  Un saco lleno de lquido (quiste) o un crecimiento anormal (tumor) en el tnel carpiano.  Acumulacin de lquido durante el embarazo. Algunas veces, la causa no se conoce. Qu incrementa el riesgo? Los siguientes factores pueden hacer que sea ms propenso a contraer esta afeccin:  Tener un trabajo que exige mover la mueca del mismo modo muchas veces. Esto incluye trabajos como el de carnicero o el de cajero.  Ser mujer.  Tener otras afecciones, por ejemplo: ? Diabetes. ? Obesidad. ? Una glndula tiroidea que no es lo suficientemente activa (hipotiroidismo). ? Insuficiencia renal. Cules son los signos o sntomas? Los sntomas de esta afeccin incluyen:  Sensacin de hormigueo en los dedos.  Hormigueo o prdida de la sensibilidad (adormecimiento) en la mano.  Dolor en todo el brazo. Este dolor puede empeorar al flexionar la mueca y el codo durante mucho tiempo.  Dolor en la mueca que sube por el brazo hasta el hombro.  Dolor que baja hasta la palma de la mano o los dedos.  Sensacin de debilidad en las manos. Puede resultarle difcil tomar y sostener objetos. Es posible que se sienta peor por la noche. Cmo se diagnostica? Esta afeccin se diagnostica mediante los antecedentes mdicos y un examen fsico. Tambin pueden hacerle estudios, por ejemplo:  Una electromiograma (EMG). Este estudio comprueba las seales  que los nervios envan a los msculos.  Estudio de conduccin nerviosa. Este estudio comprueba si las seales pasan correctamente por los nervios.  Estudios de diagnstico por imgenes, como radiografas, una ecografa y una resonancia magntica (RM). Estos estudios permiten detectar cul podra ser la causa de su afeccin. Cmo se trata? El tratamiento de esta afeccin puede incluir:  Cambios en el estilo de vida. Se le pedir que deje o cambie la actividad que caus el problema.  Hacer ejercicio y actividades para que los msculos y los huesos se vuelvan ms fuertes (fisioterapia).  Aprender a usar la mano nuevamente (terapia ocupacional).  Medicamentos para tratar el dolor y la hinchazn (inflamacin). Es posible que le apliquen inyecciones en la mueca.  Una frula para la mueca.  Una ciruga. Siga estas indicaciones en su casa: Si tiene una frula:  Use la frula como se lo haya indicado el mdico. Quteselo solamente como se lo haya indicado el mdico.  Afloje la frula si los dedos: ? Hormiguean. ? Pierden la sensibilidad (se adormecen). ? Se tornan fros y de color azul.  Mantenga la frula limpia.  Si la frula no es impermeable: ? No deje que se moje. ? Cbralo con un envoltorio hermtico cuando tome un bao de inmersin o una ducha. Control del dolor, la rigidez y la hinchazn   Si se lo indican, aplique hielo sobre la zona dolorida: ? Si tiene una frula desmontable, qutesela como se lo haya indicado el mdico. ? Ponga el hielo en una bolsa plstica. ? Coloque una toalla entre la piel y la bolsa. ? Coloque el hielo   durante 20minutos, 2a3veces al da. Indicaciones generales  Tome los medicamentos de venta libre y los recetados solamente como se lo haya indicado el mdico.  Descanse la mueca de cualquier actividad que le cause dolor. Si es necesario, hable con su jefe en el trabajo sobre los cambios que pueden ayudar a la curacin de la mueca.  Haga  los ejercicios como se lo hayan indicado el mdico, el fisioterapeuta o el terapeuta ocupacional.  Concurra a todas las visitas de seguimiento como se lo haya indicado el mdico. Esto es importante. Comunquese con un mdico si:  Aparecen nuevos sntomas.  Los medicamentos no le alivian el dolor.  Sus sntomas empeoran. Solicite ayuda de inmediato si:  Tiene adormecimiento u hormigueo muy intensos en la mueca o la mano. Resumen  El sndrome del tnel carpiano es una afeccin que causa dolor en la mano y en el brazo.  Suele deberse a movimientos repetidos de la mueca.  Este problema se trata mediante cambios en el estilo de vida y medicamentos. La ciruga puede ser necesaria en casos muy graves.  Siga las indicaciones del mdico sobre el uso de una frula, el reposo de la mueca, la asistencia a las consultas de control y llamar para pedir ayuda. Esta informacin no tiene como fin reemplazar el consejo del mdico. Asegrese de hacerle al mdico cualquier pregunta que tenga. Document Revised: 11/29/2017 Document Reviewed: 11/29/2017 Elsevier Patient Education  2020 Elsevier Inc.  

## 2020-05-05 NOTE — Progress Notes (Signed)
Subjective:  Patient ID: Melinda Spears, female    DOB: [birth year]  Age: 44 y.o. MRN: [id number]  CC: Leg Pain   HPI Melinda Spears presents to establish care. She has burning in her hands and numbness for 1 week worse at night and knees hurt when she stands from a sitting position and is described as located on the lateral aspect of her L knee extending to area below the L knee and upwards to lower thigh. L symptoms >R. States she was previously diagnosed with Arthritis. Denies presence of numbness in feet, she is not diabetic and is R handed.  Past Medical History:  Diagnosis Date  . Acute meniscal tear, lateral    right leg  . Blood transfusion 2009   s/p kidney surgery  . Hx successful VBAC (vaginal birth after cesarean), currently pregnant   . Kidney calculus    right staghorn  . Kidney stone 2009  . Knee effusion, right    joint  . Pain, dental   . Varicose veins of both lower extremities     Past Surgical History:  Procedure Laterality Date  . APPENDECTOMY    . CESAREAN SECTION    . KIDNEY STONE SURGERY  02/20/2007   repeat pcnl for remaining right kidney stone fragments  . KIDNEY STONE SURGERY  03/19/2007   for futher stone fragment removal  . KIDNEY SURGERY  2009  . NEPHROLITHOTOMY  12/21/2006   percutaneous nephrolithotomy of right staghorn calculus- carbonate apatite    Family History  Problem Relation Age of Onset  . Diabetes Father   . Other Neg Hx   . Breast cancer Neg Hx     No Known Allergies  Outpatient Medications Prior to Visit  Medication Sig Dispense Refill  . ibuprofen (ADVIL,MOTRIN) 600 MG tablet Take 1 tablet (600 mg total) by mouth every 8 (eight) hours as needed for moderate pain. Take after eating (Patient not taking: Reported on 04/03/2019) 60 tablet 3   No facility-administered medications prior to visit.     ROS Review of Systems  Constitutional: Negative for activity change, appetite change and fatigue.  HENT:  Negative for congestion, sinus pressure and sore throat.   Eyes: Negative for visual disturbance.  Respiratory: Negative for cough, chest tightness, shortness of breath and wheezing.   Cardiovascular: Negative for chest pain and palpitations.  Gastrointestinal: Negative for abdominal distention, abdominal pain and constipation.  Endocrine: Negative for polydipsia.  Genitourinary: Negative for dysuria and frequency.  Musculoskeletal:       See HPI  Skin: Negative for rash.  Neurological: Positive for numbness. Negative for tremors and light-headedness.  Hematological: Does not bruise/bleed easily.  Psychiatric/Behavioral: Negative for agitation and behavioral problems.    Objective:  BP 103/60   Pulse 75   Ht _0  (1.651 m)   Wt 156 lb (70.8 kg)   SpO2 100%   BMI 25.96 kg/m   BP/Weight 05/05/2020 11/27/2019 7/89/3810  Systolic BP 175 102 585  Diastolic BP 60 81 80  Wt. (Lbs) 156 150 151  BMI 25.96 24.96 25.13      Physical Exam Constitutional:      Appearance: She is well-developed.  Neck:     Vascular: No JVD.  Cardiovascular:     Rate and Rhythm: Normal rate.     Heart sounds: Normal heart sounds. No murmur heard.   Pulmonary:     Effort: Pulmonary effort is normal.     Breath sounds: Normal breath sounds.  No wheezing or rales.  Chest:     Chest wall: No tenderness.  Abdominal:     General: Bowel sounds are normal. There is no distension.     Palpations: Abdomen is soft. There is no mass.     Tenderness: There is no abdominal tenderness.  Musculoskeletal:        General: Tenderness (TTP lateral aspect of L knee and on flexion and extension. R knee is normal) present. Normal range of motion.     Right lower leg: No edema.     Left lower leg: No edema.     Comments: Positive Tinel and Phalen's signs  Neurological:     Mental Status: She is alert and oriented to person, place, and time.  Psychiatric:        Mood and Affect: Mood normal.     CMP Latest Ref  Rng & Units 02/20/2013 01/10/2013 01/09/2013  Glucose 70 - 99 mg/dL 96 95 101(H)  BUN 6 - 23 mg/dL 13 3(L) 5(L)  Creatinine 0.50 - 1.10 mg/dL 0.83 0.63 0.66  Sodium 135 - 145 mEq/L 139 138 133(L)  Potassium 3.5 - 5.3 mEq/L 4.4 3.6 3.2(L)  Chloride 96 - 112 mEq/L 104 107 103  CO2 19 - 32 mEq/L _0 Calcium 8.4 - 10.5 mg/dL 9.2 8.3(L) 7.9(L)  Total Protein 6.0 - 8.3 g/dL 7.0 - -  Total Bilirubin 0.3 - 1.2 mg/dL 0.3 - -  Alkaline Phos 39 - 117 U/L 70 - -  AST 0 - 37 U/L 14 - -  ALT 0 - 35 U/L 15 - -    Lipid Panel  No results found for: CHOL, TRIG, HDL, CHOLHDL, VLDL, LDLCALC, LDLDIRECT  CBC    Component Value Date/Time   WBC 5.7 02/20/2013 1611   RBC 3.99 02/20/2013 1611   HGB 12.5 02/20/2013 1611   HCT 37.9 02/20/2013 1611   PLT 252 02/20/2013 1611   MCV 95.0 02/20/2013 1611   MCH 31.3 02/20/2013 1611   MCHC 33.0 02/20/2013 1611   RDW 14.0 02/20/2013 1611   LYMPHSABS 2.1 02/20/2013 1611   MONOABS 0.5 02/20/2013 1611   EOSABS 0.1 02/20/2013 1611   BASOSABS 0.0 02/20/2013 1611    Lab Results  Component Value Date   HGBA1C 5.4 05/05/2020    Assessment & Plan:  1. Bilateral carpal tunnel syndrome Advised to use wrist braces Screen for Diabetes is negative - Vitamin B12 - CMP14+EGFR - gabapentin (NEURONTIN) 300 MG capsule; Take 1 capsule (300 mg total) by mouth at bedtime.  Dispense: 30 capsule; Refill: 3  2. Screening for diabetes mellitus A1c is normal at 5.4 - POCT glycosylated hemoglobin (Hb A1C)  3. Primary osteoarthritis of both knees Advised on exercising, use of ice, knee brace, elevation - meloxicam (MOBIC) 7.5 MG tablet; Take 1 tablet (7.5 mg total) by mouth daily.  Dispense: 30 tablet; Refill: 3   Meds ordered this encounter  Medications  . meloxicam (MOBIC) 7.5 MG tablet    Sig: Take 1 tablet (7.5 mg total) by mouth daily.    Dispense:  30 tablet    Refill:  3  . gabapentin (NEURONTIN) 300 MG capsule    Sig: Take 1 capsule (300 mg total) by  mouth at bedtime.    Dispense:  30 capsule    Refill:  3    Follow-up: Return in about 3 months (around 08/05/2020) for Osteoarthritis.       Charlott Rakes, MD, FAAFP. [facility]  [place], [place]   05/05/2020, 12:42 PM

## 2020-05-05 NOTE — Progress Notes (Signed)
Having pain in legs. 

## 2020-05-06 ENCOUNTER — Telehealth: Payer: Self-pay

## 2020-05-06 LAB — CMP14+EGFR
ALT: 16 IU/L (ref 0–32)
AST: 17 IU/L (ref 0–40)
Albumin/Globulin Ratio: 1.7 (ref 1.2–2.2)
Albumin: 4.8 g/dL (ref 3.8–4.8)
Alkaline Phosphatase: 64 IU/L (ref 44–121)
BUN/Creatinine Ratio: 19 (ref 9–23)
BUN: 14 mg/dL (ref 6–24)
Bilirubin Total: 0.4 mg/dL (ref 0.0–1.2)
CO2: 26 mmol/L (ref 20–29)
Calcium: 9.7 mg/dL (ref 8.7–10.2)
Chloride: 100 mmol/L (ref 96–106)
Creatinine, Ser: 0.74 mg/dL (ref 0.57–1.00)
GFR calc Af Amer: 116 mL/min/{1.73_m2} (ref >59)
GFR calc non Af Amer: 100 mL/min/{1.73_m2} (ref >59)
Globulin, Total: 2.8 g/dL (ref 1.5–4.5)
Glucose: 92 mg/dL (ref 65–99)
Potassium: 4.6 mmol/L (ref 3.5–5.2)
Sodium: 138 mmol/L (ref 134–144)
Total Protein: 7.6 g/dL (ref 6.0–8.5)

## 2020-05-06 LAB — VITAMIN B12: Vitamin B-12: 827 pg/mL (ref 232–1245)

## 2020-05-06 NOTE — Telephone Encounter (Signed)
VM left informing patient of normal lab results.

## 2020-05-06 NOTE — Telephone Encounter (Signed)
-----   Message from Hoy Register, MD sent at 05/06/2020 12:22 PM EDT ----- Please inform the patient that labs are normal. Thank you.

## 2020-05-12 ENCOUNTER — Telehealth: Payer: Self-pay | Admitting: *Deleted

## 2020-05-12 ENCOUNTER — Ambulatory Visit: Payer: No Typology Code available for payment source

## 2020-05-12 NOTE — Telephone Encounter (Signed)
I call back the Pt, and reschedule the appt for next week

## 2020-05-12 NOTE — Telephone Encounter (Signed)
Copied from CRM [id number]. Topic: Quick Communication - Appointment Cancellation >> May 12, 2020  9:05 AM Leafy Ro wrote: Patient called to cancel appointment scheduled for carlos  for today and would like to rsc

## 2020-05-20 ENCOUNTER — Ambulatory Visit: Payer: Self-pay | Attending: Family Medicine

## 2020-05-20 ENCOUNTER — Other Ambulatory Visit: Payer: Self-pay

## 2020-07-27 ENCOUNTER — Other Ambulatory Visit: Payer: Self-pay | Admitting: Family Medicine

## 2020-07-27 ENCOUNTER — Encounter: Payer: Self-pay | Admitting: Family Medicine

## 2020-07-27 ENCOUNTER — Other Ambulatory Visit: Payer: Self-pay

## 2020-07-27 ENCOUNTER — Ambulatory Visit: Payer: Self-pay | Attending: Family Medicine | Admitting: Family Medicine

## 2020-07-27 DIAGNOSIS — M170 Bilateral primary osteoarthritis of knee: Secondary | ICD-10-CM

## 2020-07-27 DIAGNOSIS — G5603 Carpal tunnel syndrome, bilateral upper limbs: Secondary | ICD-10-CM

## 2020-07-27 MED ORDER — TRAMADOL HCL 50 MG PO TABS: 50.0000 mg | ORAL_TABLET | Freq: Every evening | ORAL | 1 refills | Status: DC | PRN

## 2020-07-27 MED ORDER — PREDNISONE 20 MG PO TABS: 20.0000 mg | ORAL_TABLET | Freq: Every day | ORAL | 0 refills | Status: DC

## 2020-07-27 MED ORDER — GABAPENTIN 300 MG PO CAPS: 300.0000 mg | ORAL_CAPSULE | Freq: Every day | ORAL | 3 refills | Status: DC

## 2020-07-27 MED ORDER — MELOXICAM 7.5 MG PO TABS: 7.5000 mg | ORAL_TABLET | Freq: Every day | ORAL | 3 refills | Status: DC

## 2020-07-27 MED FILL — GABAPENTIN 300 MG CAPSULE: 300 | 30 days supply | Qty: 30 | Fill #0

## 2020-07-27 MED FILL — MELOXICAM 7.5 MG TABLET: 7.5 | 30 days supply | Qty: 30 | Fill #0

## 2020-07-27 MED FILL — predniSONE 20 MG TABS: 20 | 5 days supply | Qty: 5 | Fill #0

## 2020-07-27 MED FILL — traMADol HCL 50 MG TABS: 50 | 30 days supply | Qty: 30 | Fill #0

## 2020-07-27 NOTE — Patient Instructions (Signed)
Dolor de rodilla crnico en los adultos Chronic Knee Pain, Adult El dolor de rodilla que dura ms de 3 meses se denomina dolor de rodilla crnico. Puede sentir dolor en una o en ambas rodillas. Los sntomas de dolor de rodilla crnico tambin pueden incluir hinchazn y rigidez. La causa ms frecuente es el desgaste de la articulacin de la rodilla (osteoartritis) relacionado con la edad. Muchas afecciones pueden causar dolor crnico en las rodillas. El tratamiento depende de la causa. Los tratamientos principales son la fisioterapia y la prdida de peso. Tambin puede tratarse con medicamentos, inyecciones, una rodillera o un dispositivo ortopdico, y el uso de muletas. Adems, puede recomendarse reposo, hielo, presin (compresin) y elevacin, lo que tambin se conoce como terapia RHCE. Siga estas instrucciones en su casa: Si tiene una rodillera o un dispositivo ortopdico:  Use la rodillera o el dispositivo ortopdico como se lo haya indicado el mdico. Quteselos solamente como se lo haya indicado el mdico.  Afljeselos si los dedos del pie: ? Hormiguean. ? Se adormecen. ? Se tornan fros y de color azul.  Mantngalos limpios.  Si la rodillera o el dispositivo ortopdico no son impermeables: ? No deje que se mojen. ? Pregntele al mdico si puede quitrselos cuando toma un bao de inmersin o una ducha. Si no es as, use una cobertura impermeable.   Control del dolor, la rigidez y la hinchazn  Si se lo indican, aplique calor sobre la rodilla. Hgalo con la frecuencia que le haya indicado el mdico. Use la fuente de calor que el mdico le recomiende, como una compresa de calor hmedo o una almohadilla trmica. ? Si tiene una rodillera o un dispositivo ortopdico que se puede quitar, proceda como se lo haya indicado el mdico. ? Coloque una toalla entre la piel y la fuente de calor. ? Aplique calor durante 20 a 30minutos. ? Retire la fuente de calor si la piel se le pone de color rojo  brillante. Esto es muy importante. Si no puede sentir dolor, calor o fro, tiene un mayor riesgo de quemarse.  Si se lo indican, aplique hielo sobre la rodilla. Para hacer esto: ? Si tiene una rodillera o un dispositivo ortopdico que se puede quitar, proceda como se lo haya indicado el mdico. ? Ponga el hielo en una bolsa plstica. ? Coloque una toalla entre la piel y la bolsa. ? Aplique el hielo durante 20minutos, 2 o 3veces por da. ? Retire el hielo si la piel se le pone de color rojo brillante. Esto es muy importante. Si no puede sentir dolor, calor o fro, tiene un mayor riesgo de que se dae la zona.  Mueva los dedos del pie con frecuencia.  Cuando est sentado o acostado, mantenga la zona lesionada por encima del nivel del corazn.      Actividad  Evite las actividades en las que ambos pies no estn en contacto con el piso al mismo tiempo (actividades de alto impacto). Algunos ejemplos son correr, saltar la soga y hacer saltos de tijera.  Siga el plan de ejercicios que el mdico le prepare. El mdico puede sugerirle que haga lo siguiente: ? Evitar las actividades que empeoren el dolor de rodilla. Es posible que deba cambiar los ejercicios que hace, los deportes que practica o sus obligaciones laborales. ? Usar calzado con suelas acolchonadas. ? Evitar deportes que requieran correr y cambiar de direccin repentinamente. ? Realizar ejercicios o fisioterapia. Esto se planifica en funcin de sus necesidades y capacidades. ? Haga ejercicios que   mejoren el equilibrio y la fuerza, como el tai chi y el yoga.  No apoye el peso del cuerpo sobre la rodilla lesionada hasta que el mdico lo autorice. Utilice las muletas como se lo haya indicado el mdico.  Regrese a sus actividades normales cuando el mdico le diga que es seguro. Indicaciones generales  Use los medicamentos de venta libre y los recetados solamente como se lo haya indicado el mdico.  Si tiene sobrepeso, trabaje con su  mdico y un experto en alimentos (nutricionista) para establecer metas para bajar de peso. El sobrepeso puede aumentar el dolor de rodilla.  No fume ni consuma ningn producto que contenga nicotina o tabaco. Si necesita ayuda para dejar de consumir estos productos, consulte al mdico.  Cumpla con todas las visitas de seguimiento. Comunquese con un mdico si:  Tiene un dolor de rodilla que no mejora o que empeora.  No puede hacer los ejercicios debido al dolor de rodilla. Solicite ayuda de inmediato si:  La rodilla se hincha y la hinchazn empeora.  No puede mover la rodilla.  Siente mucho dolor en la rodilla. Resumen  El dolor de rodilla que dura ms de 3 meses se denomina dolor de rodilla crnico.  Los tratamientos principales para el dolor de rodilla crnico son la fisioterapia y la prdida de peso. Tambin es posible que deba tomar medicamentos, usar una rodillera o un dispositivo ortopdico, usar muletas y aplicarse hielo o calor en la rodilla.  Baje de peso si es necesario. Trabaje con su mdico y un experto en alimentacin (nutricionista) para que le ayuden a establecer metas para bajar de peso. El sobrepeso puede aumentar el dolor de rodilla.  Siga el plan de ejercicios que el mdico le prepare. Esta informacin no tiene como fin reemplazar el consejo del mdico. Asegrese de hacerle al mdico cualquier pregunta que tenga. Document Revised: 01/27/2020 Document Reviewed: 01/27/2020 Elsevier Patient Education  2021 Elsevier Inc.  

## 2020-07-27 NOTE — Progress Notes (Signed)
Subjective:  Patient ID: Melinda Spears, female    DOB: Oct 13, 1975  Age: 45 y.o. MRN: [id number]  CC: Joint Swelling (bilateral)   HPI Melinda Spears is a 45 year old female with a history of osteoarthritis of the knees, carpal tunnel syndrome who presents today for a follow-up visit.  Both knees have been swollen for the last 3 days and pain has worsened as well.  Meloxicam has not provided relief and she is having to wear knee braces.  Pain is worse with prolonged standing and going up and down the stairs. Her carpal tunnel syndrome is stable on gabapentin.  Past Medical History:  Diagnosis Date  . Acute meniscal tear, lateral    right leg  . Blood transfusion 2009   s/p kidney surgery  . Hx successful VBAC (vaginal birth after cesarean), currently pregnant   . Kidney calculus    right staghorn  . Kidney stone 2009  . Knee effusion, right    joint  . Pain, dental   . Varicose veins of both lower extremities     Past Surgical History:  Procedure Laterality Date  . APPENDECTOMY    . CESAREAN SECTION    . KIDNEY STONE SURGERY  02/20/2007   repeat pcnl for remaining right kidney stone fragments  . KIDNEY STONE SURGERY  03/19/2007   for futher stone fragment removal  . KIDNEY SURGERY  2009  . NEPHROLITHOTOMY  12/21/2006   percutaneous nephrolithotomy of right staghorn calculus- carbonate apatite    Family History  Problem Relation Age of Onset  . Diabetes Father   . Other Neg Hx   . Breast cancer Neg Hx     No Known Allergies  Outpatient Medications Prior to Visit  Medication Sig Dispense Refill  . gabapentin (NEURONTIN) 300 MG capsule Take 1 capsule (300 mg total) by mouth at bedtime. 30 capsule 3  . meloxicam (MOBIC) 7.5 MG tablet Take 1 tablet (7.5 mg total) by mouth daily. 30 tablet 3  . ibuprofen (ADVIL,MOTRIN) 600 MG tablet Take 1 tablet (600 mg total) by mouth every 8 (eight) hours as needed for moderate pain. Take after eating (Patient not  taking: No sig reported) 60 tablet 3   No facility-administered medications prior to visit.     ROS Review of Systems  Constitutional: Negative for activity change, appetite change and fatigue.  HENT: Negative for congestion, sinus pressure and sore throat.   Eyes: Negative for visual disturbance.  Respiratory: Negative for cough, chest tightness, shortness of breath and wheezing.   Cardiovascular: Negative for chest pain and palpitations.  Gastrointestinal: Negative for abdominal distention, abdominal pain and constipation.  Endocrine: Negative for polydipsia.  Genitourinary: Negative for dysuria and frequency.  Musculoskeletal:       See HPI.  Skin: Negative for rash.  Neurological: Negative for tremors, light-headedness and numbness.  Hematological: Does not bruise/bleed easily.  Psychiatric/Behavioral: Negative for agitation and behavioral problems.    Objective:  BP 114/75 (BP Location: Left Arm, Patient Position: Sitting, Cuff Size: Normal)   Pulse 82   Temp 98.7 F (37.1 C) (Oral)   Resp 18   Ht 5\' 7"  (1.702 m)   Wt 161 lb (73 kg)   LMP 07/19/2020   SpO2 100%   BMI 25.22 kg/m   BP/Weight 07/27/2020 05/05/2020 11/27/2019  Systolic BP 114 103 126  Diastolic BP 75 60 81  Wt. (Lbs) 161 156 150  BMI 25.22 25.96 24.96      Physical Exam Constitutional:  Appearance: She is well-developed.  Neck:     Vascular: No JVD.  Cardiovascular:     Rate and Rhythm: Normal rate.     Heart sounds: Normal heart sounds. No murmur heard.   Pulmonary:     Effort: Pulmonary effort is normal.     Breath sounds: Normal breath sounds. No wheezing or rales.  Chest:     Chest wall: No tenderness.  Abdominal:     General: Bowel sounds are normal. There is no distension.     Palpations: Abdomen is soft. There is no mass.     Tenderness: There is no abdominal tenderness.  Musculoskeletal:        General: Normal range of motion.     Right lower leg: No edema.     Left lower  leg: No edema.  Neurological:     Mental Status: She is alert and oriented to person, place, and time.  Psychiatric:        Mood and Affect: Mood normal.     CMP Latest Ref Rng & Units 05/05/2020 02/20/2013 01/10/2013  Glucose 65 - 99 mg/dL 92 96 95  BUN 6 - 24 mg/dL 14 13 3(L)  Creatinine 0.57 - 1.00 mg/dL 1.74 9.44 9.67  Sodium 134 - 144 mmol/L 138 139 138  Potassium 3.5 - 5.2 mmol/L 4.6 4.4 3.6  Chloride 96 - 106 mmol/L 100 104 107  CO2 20 - 29 mmol/L 26 28 23   Calcium 8.7 - 10.2 mg/dL 9.7 9.2 )  Total Protein 6.0 - 8.5 g/dL 7.6 7.0 -  Total Bilirubin 0.0 - 1.2 mg/dL 0.4 0.3 -  Alkaline Phos 44 - 121 IU/L 64 70 -  AST 0 - 40 IU/L 17 14 -  ALT 0 - 32 IU/L 16 15 -    Lipid Panel  No results found for: CHOL, TRIG, HDL, CHOLHDL, VLDL, LDLCALC, LDLDIRECT  CBC    Component Value Date/Time   WBC 5.7 02/20/2013 1611   RBC 3.99 02/20/2013 1611   HGB 12.5 02/20/2013 1611   HCT 37.9 02/20/2013 1611   PLT 252 02/20/2013 1611   MCV 95.0 02/20/2013 1611   MCH 31.3 02/20/2013 1611   MCHC 33.0 02/20/2013 1611   RDW 14.0 02/20/2013 1611   LYMPHSABS 2.1 02/20/2013 1611   MONOABS 0.5 02/20/2013 1611   EOSABS 0.1 02/20/2013 1611   BASOSABS 0.0 02/20/2013 1611    Lab Results  Component Value Date   HGBA1C 5.4 05/05/2020    Assessment & Plan:  1. Primary osteoarthritis of both knees Uncontrolled Short course of prednisone I will add tramadol to her regimen Consider referral to orthopedics for possible corticosteroid injection if symptoms persist - DG Knee Complete 4 Views Right; Future - DG Knee Complete 4 Views Left; Future - meloxicam (MOBIC) 7.5 MG tablet; Take 1 tablet (7.5 mg total) by mouth daily.  Dispense: 30 tablet; Refill: 3 - predniSONE (DELTASONE) 20 MG tablet; Take 1 tablet (20 mg total) by mouth daily with breakfast.  Dispense: 5 tablet; Refill: 0 - traMADol (ULTRAM) 50 MG tablet; Take 1 tablet (50 mg total) by mouth at bedtime as needed for up to 5 days.   Dispense: 30 tablet; Refill: 1  2. Bilateral carpal tunnel syndrome Stable - gabapentin (NEURONTIN) 300 MG capsule; Take 1 capsule (300 mg total) by mouth at bedtime.  Dispense: 30 capsule; Refill: 3    Meds ordered this encounter  Medications  . meloxicam (MOBIC) 7.5 MG tablet    Sig: Take 1  tablet (7.5 mg total) by mouth daily.    Dispense:  30 tablet    Refill:  3  . predniSONE (DELTASONE) 20 MG tablet    Sig: Take 1 tablet (20 mg total) by mouth daily with breakfast.    Dispense:  5 tablet    Refill:  0  . gabapentin (NEURONTIN) 300 MG capsule    Sig: Take 1 capsule (300 mg total) by mouth at bedtime.    Dispense:  30 capsule    Refill:  3  . traMADol (ULTRAM) 50 MG tablet    Sig: Take 1 tablet (50 mg total) by mouth at bedtime as needed for up to 5 days.    Dispense:  30 tablet    Refill:  1    Follow-up: Return in about 3 months (around 10/25/2020) for Chronic knee pain.       Hoy Register, MD, FAAFP. [facility] [place], [place] [phone]   07/27/2020, 5:27 PM

## 2020-07-27 NOTE — Progress Notes (Signed)
Patient has eaten today. Patient shares that gabapentin, ibuprofen and meloxicam are providing no relief for the knee pain or discomfort. Patient shares pain and swelling has increased over the past 3 days.

## 2020-07-29 ENCOUNTER — Other Ambulatory Visit: Payer: Self-pay

## 2020-07-29 ENCOUNTER — Emergency Department (HOSPITAL_COMMUNITY)
Admission: EM | Admit: 2020-07-29 | Discharge: 2020-07-29 | Discharge status: Against Medical Advice | Payer: No Typology Code available for payment source | Attending: Family Medicine | Admitting: Family Medicine

## 2020-07-29 ENCOUNTER — Ambulatory Visit (HOSPITAL_COMMUNITY)
Admission: RE | Admit: 2020-07-29 | Discharge: 2020-07-29 | Disposition: A | Discharge status: Home / Self Care | Payer: Self-pay | Source: Ambulatory Visit | Attending: Family Medicine | Admitting: Family Medicine

## 2020-07-29 DIAGNOSIS — M170 Bilateral primary osteoarthritis of knee: Secondary | ICD-10-CM | POA: Insufficient documentation

## 2020-07-29 NOTE — ED Notes (Signed)
Unable to locate in lobby for triage.

## 2020-07-30 ENCOUNTER — Telehealth: Payer: Self-pay

## 2020-07-30 ENCOUNTER — Other Ambulatory Visit: Payer: Self-pay | Admitting: Family Medicine

## 2020-07-30 DIAGNOSIS — M25461 Effusion, right knee: Secondary | ICD-10-CM

## 2020-07-30 NOTE — Telephone Encounter (Signed)
Patient was called and a voicemail was left informing patient to return phone call for lab results. 

## 2020-07-30 NOTE — Telephone Encounter (Signed)
-----   Message from Hoy Register, MD sent at 07/30/2020  9:53 AM EST ----- She does have worsening arthritis in both knees, some fluid in the R knee from her xrays and I have referred her to Ortho for further evaluation. Thanks

## 2020-08-09 ENCOUNTER — Other Ambulatory Visit: Payer: Self-pay

## 2020-08-09 ENCOUNTER — Encounter: Payer: Self-pay | Admitting: Family Medicine

## 2020-08-09 ENCOUNTER — Ambulatory Visit: Payer: Self-pay | Attending: Family Medicine | Admitting: Family Medicine

## 2020-08-09 VITALS — BP 104/61 | HR 69 | Ht 67.0 in | Wt 159.8 lb

## 2020-08-09 DIAGNOSIS — M25461 Effusion, right knee: Secondary | ICD-10-CM

## 2020-08-09 DIAGNOSIS — M170 Bilateral primary osteoarthritis of knee: Secondary | ICD-10-CM

## 2020-08-09 NOTE — Patient Instructions (Signed)
Derrame en la rodilla Knee Effusion Un derrame en la rodilla es un exceso de lquido en la articulacin de la rodilla. Esto puede causar dolor e hinchazn en la zona. Un derrame en la rodilla crea ms presin de lo normal en la articulacin de la rodilla. Esto hace que sea ms difcil flexionar y Merchant navy officer rodilla. Si hay lquido en la rodilla, a menudo significa que hay algn problema en la rodilla. Esto puede deberse a lo siguiente:  Artritis grave.  Lesin en los msculos o los tejidos de la rodilla (ligamentos o Dietitian).  Infeccin.  Enfermedad autoinmunitaria. Esto significa que el sistema de defensa (sistema inmunitario) del cuerpo ataca por error tejidos corporales sanos. Siga estas instrucciones en su casa: Si tiene un dispositivo ortopdico o un inmovilizador:  selos como se lo haya indicado el mdico. Charity fundraiser se lo haya indicado el mdico.  Controle todos los [place] la piel a su alrededor. Informe al mdico acerca de cualquier inquietud.  Afljelos si siente hormigueo en los dedos de los pies, si se le adormecen, o se le enfran y se tornan azulados.  Mantngalos limpios.  Si el dispositivo ortopdico o el inmovilizador no son impermeables: ? No deje que se mojen. ? Cbralos con un envoltorio hermtico cuando tome un bao de inmersin o una ducha. Control del dolor, la rigidez y la hinchazn  Si se lo indican, aplique hielo sobre la zona afectada. Para hacer esto: ? Si tiene un dispositivo ortopdico o un inmovilizador desmontable, quteselos como se lo haya indicado el mdico. ? Ponga el hielo en una bolsa plstica. ? Coloque una [employer] piel y Copy. ? Aplique el hielo durante , 2 o 3veces por da. ? Retire el hielo si la piel se pone de color rojo brillante. Esto es [employer]. Si no puede sentir dolor, calor o fro, tiene un mayor riesgo de que se dae la zona.  Mueva los dedos del pie con frecuencia para reducir la rigidez  y la hinchazn.  Cuando est sentado o acostado, levante (eleve) la rodilla por encima del nivel del corazn.   Actividad  Haga reposo como se lo haya indicado el mdico.  No apoye el peso del cuerpo sobre la extremidad [employer] que lo autorice el mdico. Use las [employer] se lo haya indicado el mdico.  Haga ejercicio como se lo haya indicado el mdico. Indicaciones generales  Use los medicamentos de venta libre y los recetados solamente como se lo haya indicado el mdico.  No consuma ningn producto que contenga nicotina o tabaco. Estos productos incluyen cigarrillos, tabaco para Theatre manager y aparatos de vapeo, como los Administrator, Civil Service. Si necesita ayuda para dejar de consumir estos productos, consulte al [employer].  Use una venda elstica o un vendaje que ejerza presin sobre la rodilla (vendaje de compresin) como se lo haya indicado el mdico.  Cumpla con todas las visitas de seguimiento. Esto es importante. Comunquese con un mdico si:  Sigue teniendo [employer] rodilla.  Tiene fiebre o escalofros. Solicite ayuda de inmediato si:  Tiene hinchazn o enrojecimiento en la rodilla que empeora o no mejora.  Siente un dolor intenso en la rodilla. Resumen  Un derrame en la rodilla es un exceso de lquido en la articulacin de la rodilla. Esto causa dolor e hinchazn, y dificultad para flexionar y Merchant navy officer rodilla.  Las causas del derrame pueden ser: Neomia Dear artritis grave, una enfermedad autoinmunitaria, una infeccin o una lesin [employer]  o los tejidos de la rodilla (ligamentos o Dietitian).  Use los medicamentos de venta libre y los recetados solamente como se lo haya indicado el mdico.  Si tiene un dispositivo ortopdico o un inmovilizador, selos como se lo haya indicado el mdico. Esta informacin no tiene Theme park manager el consejo del mdico. Asegrese de hacerle al mdico cualquier pregunta que tenga. Document Revised: 04/28/2020 Document  Reviewed: 03/12/2020 Elsevier Patient Education  2021 [employer].

## 2020-08-09 NOTE — Progress Notes (Signed)
Subjective:  Patient ID: Melinda Spears, female    DOB: April 14, 1976  Age: 45 y.o. MRN: [id number]  CC: Knee Pain   HPI Melinda Spears  is a 45 year old female with a history of osteoarthritis of the knees, carpal tunnel syndrome who presents today for a follow-up visit to discuss results of her knee x-rays. Chart revealed a voicemail has been left for her regarding her result. Knee symptoms are a bit better and swelling has improved.  I had referred her to orthopedics based on her x-ray results.  R knee x-ray IMPRESSION: Increasing degenerative change with joint effusion.  L knee x-ray IMPRESSION: Progressive degenerative change when compared with the prior study.   Past Medical History:  Diagnosis Date  . Acute meniscal tear, lateral    right leg  . Blood transfusion 2009   s/p kidney surgery  . Hx successful VBAC (vaginal birth after cesarean), currently pregnant   . Kidney calculus    right staghorn  . Kidney stone 2009  . Knee effusion, right    joint  . Pain, dental   . Varicose veins of both lower extremities     Past Surgical History:  Procedure Laterality Date  . APPENDECTOMY    . CESAREAN SECTION    . KIDNEY STONE SURGERY  02/20/2007   repeat pcnl for remaining right kidney stone fragments  . KIDNEY STONE SURGERY  03/19/2007   for futher stone fragment removal  . KIDNEY SURGERY  2009  . NEPHROLITHOTOMY  12/21/2006   percutaneous nephrolithotomy of right staghorn calculus- carbonate apatite    Family History  Problem Relation Age of Onset  . Diabetes Father   . Other Neg Hx   . Breast cancer Neg Hx     No Known Allergies  Outpatient Medications Prior to Visit  Medication Sig Dispense Refill  . gabapentin (NEURONTIN) 300 MG capsule Take 1 capsule (300 mg total) by mouth at bedtime. 30 capsule 3  . meloxicam (MOBIC) 7.5 MG tablet Take 1 tablet (7.5 mg total) by mouth daily. 30 tablet 3  . predniSONE (DELTASONE) 20 MG tablet Take 1 tablet  (20 mg total) by mouth daily with breakfast. (Patient not taking: Reported on 08/09/2020) 5 tablet 0   No facility-administered medications prior to visit.     ROS Review of Systems  Constitutional: Negative for activity change, appetite change and fatigue.  HENT: Negative for congestion, sinus pressure and sore throat.   Eyes: Negative for visual disturbance.  Respiratory: Negative for cough, chest tightness, shortness of breath and wheezing.   Cardiovascular: Negative for chest pain and palpitations.  Gastrointestinal: Negative for abdominal distention, abdominal pain and constipation.  Endocrine: Negative for polydipsia.  Genitourinary: Negative for dysuria and frequency.  Musculoskeletal:       See HPI  Skin: Negative for rash.  Neurological: Negative for tremors, light-headedness and numbness.  Hematological: Does not bruise/bleed easily.  Psychiatric/Behavioral: Negative for agitation and behavioral problems.    Objective:  BP 104/61   Pulse 69   Ht 5\' 7"  (1.702 m)   Wt 159 lb 12.8 oz (72.5 kg)   LMP 07/19/2020   SpO2 100%   BMI 25.03 kg/m   BP/Weight 08/09/2020 07/27/2020 05/05/2020  Systolic BP 104 114 103  Diastolic BP 61 75 60  Wt. (Lbs) 159.8 161 156  BMI 25.03 25.22 25.96      Physical Exam Constitutional:      Appearance: She is well-developed.  Neck:  Vascular: No JVD.  Cardiovascular:     Rate and Rhythm: Normal rate.     Heart sounds: Normal heart sounds. No murmur heard.   Pulmonary:     Effort: Pulmonary effort is normal.     Breath sounds: Normal breath sounds. No wheezing or rales.  Chest:     Chest wall: No tenderness.  Abdominal:     General: Bowel sounds are normal. There is no distension.     Palpations: Abdomen is soft. There is no mass.     Tenderness: There is no abdominal tenderness.  Musculoskeletal:        General: Tenderness (slight tenderness on range of motion of right knee) present. No swelling. Normal range of motion.      Right lower leg: No edema.     Left lower leg: No edema.  Neurological:     Mental Status: She is alert and oriented to person, place, and time.  Psychiatric:        Mood and Affect: Mood normal.     CMP Latest Ref Rng & Units 05/05/2020 02/20/2013 01/10/2013  Glucose 65 - 99 mg/dL 92 96 95  BUN 6 - 24 mg/dL 14 13 3(L)  Creatinine 0.57 - 1.00 mg/dL 3.30 0.76 2.26  Sodium 134 - 144 mmol/L 138 139 138  Potassium 3.5 - 5.2 mmol/L 4.6 4.4 3.6  Chloride 96 - 106 mmol/L 100 104 107  CO2 20 - 29 mmol/L 26 28 23   Calcium 8.7 - 10.2 mg/dL 9.7 9.2 )  Total Protein 6.0 - 8.5 g/dL 7.6 7.0 -  Total Bilirubin 0.0 - 1.2 mg/dL 0.4 0.3 -  Alkaline Phos 44 - 121 IU/L 64 70 -  AST 0 - 40 IU/L 17 14 -  ALT 0 - 32 IU/L 16 15 -    Lipid Panel  No results found for: CHOL, TRIG, HDL, CHOLHDL, VLDL, LDLCALC, LDLDIRECT  CBC    Component Value Date/Time   WBC 5.7 02/20/2013 1611   RBC 3.99 02/20/2013 1611   HGB 12.5 02/20/2013 1611   HCT 37.9 02/20/2013 1611   PLT 252 02/20/2013 1611   MCV 95.0 02/20/2013 1611   MCH 31.3 02/20/2013 1611   MCHC 33.0 02/20/2013 1611   RDW 14.0 02/20/2013 1611   LYMPHSABS 2.1 02/20/2013 1611   MONOABS 0.5 02/20/2013 1611   EOSABS 0.1 02/20/2013 1611   BASOSABS 0.0 02/20/2013 1611    Lab Results  Component Value Date   HGBA1C 5.4 05/05/2020    Assessment & Plan:  1. Primary osteoarthritis of both knees Improving Continue meloxicam She had been referred to orthopedics and is awaiting an appointment  2. Effusion of right knee See #1 above    No orders of the defined types were placed in this encounter.   Follow-up: Return in about 3 months (around 11/06/2020) for Knee pain.       11/08/2020, MD, FAAFP. [facility] [place], [place]   08/09/2020, 4:05 PM

## 2020-08-11 ENCOUNTER — Other Ambulatory Visit (HOSPITAL_COMMUNITY): Payer: Self-pay | Admitting: Internal Medicine

## 2020-08-11 ENCOUNTER — Ambulatory Visit: Payer: No Typology Code available for payment source | Attending: Internal Medicine

## 2020-08-11 DIAGNOSIS — Z23 Encounter for immunization: Secondary | ICD-10-CM

## 2020-08-11 NOTE — Progress Notes (Signed)
   Covid-19 Vaccination Clinic  Name:  Elbert Polyakov    MRN: [id number] DOB: [birth year]  08/11/2020  Ms. Marvetta Gibbons was observed post Covid-19 immunization for 15 minutes without incident. She was provided with Vaccine Information Sheet and instruction to access the V-Safe system.   Ms. Marvetta Gibbons was instructed to call 911 with any severe reactions post vaccine: Marland Kitchen Difficulty breathing  . Swelling of face and throat  . A fast heartbeat  . A bad rash all over body  . Dizziness and weakness   Immunizations Administered    Name Date Dose VIS Date Route   Pfizer COVID-19 Vaccine 08/11/2020 12:40 PM 0.3 mL 04/28/2020 Intramuscular   Manufacturer: [employer], [employer]   Lot: [id number]   NDC: [address]-2

## 2020-11-08 ENCOUNTER — Ambulatory Visit: Payer: No Typology Code available for payment source | Admitting: Family Medicine

## 2020-11-25 ENCOUNTER — Other Ambulatory Visit: Payer: Self-pay | Admitting: Obstetrics and Gynecology

## 2020-11-25 DIAGNOSIS — Z1231 Encounter for screening mammogram for malignant neoplasm of breast: Secondary | ICD-10-CM

## 2020-12-14 ENCOUNTER — Other Ambulatory Visit: Payer: Self-pay

## 2020-12-14 ENCOUNTER — Ambulatory Visit
Admission: RE | Admit: 2020-12-14 | Discharge: 2020-12-14 | Disposition: A | Discharge status: Home / Self Care | Payer: No Typology Code available for payment source | Source: Ambulatory Visit | Attending: Obstetrics and Gynecology | Admitting: Obstetrics and Gynecology

## 2020-12-14 DIAGNOSIS — Z1231 Encounter for screening mammogram for malignant neoplasm of breast: Secondary | ICD-10-CM

## 2020-12-22 ENCOUNTER — Telehealth: Payer: Self-pay | Admitting: Family Medicine

## 2020-12-22 ENCOUNTER — Ambulatory Visit: Payer: No Typology Code available for payment source

## 2020-12-22 NOTE — Telephone Encounter (Signed)
mistake

## 2020-12-22 NOTE — Telephone Encounter (Signed)
I called the Pt, LVM to call me back

## 2022-03-14 ENCOUNTER — Other Ambulatory Visit: Payer: Self-pay | Admitting: Obstetrics and Gynecology

## 2022-03-14 DIAGNOSIS — Z1231 Encounter for screening mammogram for malignant neoplasm of breast: Secondary | ICD-10-CM

## 2022-05-18 ENCOUNTER — Ambulatory Visit: Payer: Self-pay | Admitting: Hematology and Oncology

## 2022-05-18 ENCOUNTER — Ambulatory Visit
Admission: RE | Admit: 2022-05-18 | Discharge: 2022-05-18 | Disposition: A | Discharge status: Home / Self Care | Payer: No Typology Code available for payment source | Source: Ambulatory Visit | Attending: Family Medicine | Admitting: Family Medicine

## 2022-05-18 VITALS — BP 150/90 | Wt 177.3 lb

## 2022-05-18 DIAGNOSIS — Z1231 Encounter for screening mammogram for malignant neoplasm of breast: Secondary | ICD-10-CM

## 2022-05-18 DIAGNOSIS — Z01419 Encounter for gynecological examination (general) (routine) without abnormal findings: Secondary | ICD-10-CM

## 2022-05-18 NOTE — Patient Instructions (Signed)
Taught Melinda Spears about breast self awareness. Patient did need a Pap smear today due to last Pap smear was in 2020 per patient. Let her know [facility] will cover Pap smears every 5 years unless has a history of abnormal Pap smears. Referred [facility] to [facility] for diagnostic mammogram. Appointment scheduled for 05/18/2022. Patient aware of appointment and will be there. Let patient know will follow up with her within the next couple weeks with results. Melinda Spears verbalized understanding. Will return to clinic in one year for annual mammogram.  Pascal Lux, NP 1:48 PM

## 2022-05-18 NOTE — Progress Notes (Addendum)
[name] is a 46 y.o. [phone] female who presents to [facility] clinic today with no complaints.    Pap Smear: Pap smear completed today. Last Pap smear was 2020 at [facility] clinic and was normal. Per patient has no history of an abnormal Pap smear. Last Pap smear result is available in Epic.   Physical exam: Breasts Breasts symmetrical. No skin abnormalities bilateral breasts. No nipple retraction bilateral breasts. No nipple discharge bilateral breasts. No lymphadenopathy. No lumps palpated bilateral breasts.   MS DIGITAL SCREENING TOMO BILATERAL  Result Date: 12/16/2020 CLINICAL DATA:  Screening. EXAM: DIGITAL SCREENING BILATERAL MAMMOGRAM WITH TOMOSYNTHESIS AND CAD TECHNIQUE: Bilateral screening digital craniocaudal and mediolateral oblique mammograms were obtained. Bilateral screening digital breast tomosynthesis was performed. The images were evaluated with computer-aided detection. COMPARISON:  Previous exam(s). ACR Breast Density Category c: The breast tissue is heterogeneously dense, which may obscure small masses. FINDINGS: There are no findings suspicious for malignancy. The images were evaluated with computer-aided detection. IMPRESSION: No mammographic evidence of malignancy. A result letter of this screening mammogram will be mailed directly to the patient. RECOMMENDATION: Screening mammogram in one year. (Code:SM-B-01Y) BI-RADS CATEGORY  1: Negative. Electronically Signed   By: Ammie Ferrier M.D.   On: 12/16/2020 11:37   MS DIGITAL SCREENING TOMO BILATERAL  Result Date: 04/03/2019 CLINICAL DATA:  Screening. EXAM: DIGITAL SCREENING BILATERAL MAMMOGRAM WITH TOMO AND CAD COMPARISON:  None. ACR Breast Density Category b: There are scattered areas of fibroglandular density. FINDINGS: There are no findings suspicious for malignancy. Images were processed with CAD. IMPRESSION: No mammographic evidence of malignancy. A result letter of this screening mammogram will be mailed directly to  the patient. RECOMMENDATION: Screening mammogram in one year. (Code:SM-B-01Y) BI-RADS CATEGORY  1: Negative. Electronically Signed   By: Abelardo Diesel M.D.   On: 04/03/2019 20:48        Pelvic/Bimanual Ext Genitalia No lesions, no swelling and no discharge observed on external genitalia.        Vagina Vagina pink and normal texture. No lesions or discharge observed in vagina.        Cervix Cervix is present. Cervix pink and of normal texture. No discharge observed.    Uterus Uterus is present and palpable. Uterus in normal position and normal size.        Adnexae Bilateral ovaries present and palpable. No tenderness on palpation.         Rectovaginal No rectal exam completed today since patient had no rectal complaints. No skin abnormalities observed on exam.     Smoking History: Patient has never smoked and was not referred to quit line.    Patient Navigation: Patient education provided. Access to services provided for patient through Melinda Spears interpreter provided. No transportation provided   Colorectal Cancer Screening: Per patient has never had colonoscopy completed No complaints today.    Breast and Cervical Cancer Risk Assessment: Patient does not have family history of breast cancer, known genetic mutations, or radiation treatment to the chest before age 80. Patient does not have history of cervical dysplasia, immunocompromised, or DES exposure in-utero.  Risk Scores as of 05/18/2022     Melinda Spears           5-year 0.55 %   Lifetime 7.07 %   This patient is Hispana/Latina but has no documented birth country, so the [employer] used data from [place] patients to calculate their risk score. Document a birth country in the Demographics activity for a  more accurate score.         Last calculated by Melinda Spears, CMA on 05/18/2022 at  1:21 PM        A: BCCCP exam with pap smear No complaints with benign exam.   P: Referred patient to the [facility] for a screening mammogram. Appointment scheduled 05/18/2022.  Ilda Basset A, NP 05/18/2022 1:45 PM

## 2022-05-19 LAB — CYTOLOGY - PAP
Adequacy: ABSENT
Comment: NEGATIVE
Diagnosis: NEGATIVE
High risk HPV: NEGATIVE

## 2022-05-22 ENCOUNTER — Telehealth: Payer: Self-pay

## 2022-05-22 NOTE — Telephone Encounter (Signed)
Called patient via Erika McReynolds, [employer] to give pap smear results. Informed patient that pap smear was normal and HPV was negative. Based on this result her next pap smear will be due in 5 years. Patient voiced understanding.  

## 2022-05-23 ENCOUNTER — Other Ambulatory Visit: Payer: Self-pay | Admitting: Obstetrics and Gynecology

## 2022-05-23 DIAGNOSIS — R928 Other abnormal and inconclusive findings on diagnostic imaging of breast: Secondary | ICD-10-CM

## 2022-06-08 ENCOUNTER — Ambulatory Visit
Admission: RE | Admit: 2022-06-08 | Discharge: 2022-06-08 | Disposition: A | Discharge status: Home / Self Care | Payer: No Typology Code available for payment source | Source: Ambulatory Visit | Attending: Obstetrics and Gynecology | Admitting: Obstetrics and Gynecology

## 2022-06-08 DIAGNOSIS — R928 Other abnormal and inconclusive findings on diagnostic imaging of breast: Secondary | ICD-10-CM

## 2022-09-19 ENCOUNTER — Ambulatory Visit: Payer: Self-pay | Attending: Family Medicine | Admitting: Family Medicine

## 2022-09-19 ENCOUNTER — Encounter: Payer: Self-pay | Admitting: Family Medicine

## 2022-09-19 ENCOUNTER — Other Ambulatory Visit: Payer: Self-pay

## 2022-09-19 VITALS — BP 105/69 | HR 73 | Temp 98.3°F | Ht 67.0 in | Wt 164.2 lb

## 2022-09-19 DIAGNOSIS — M170 Bilateral primary osteoarthritis of knee: Secondary | ICD-10-CM

## 2022-09-19 MED ORDER — MELOXICAM 7.5 MG PO TABS
7.5000 mg | ORAL_TABLET | Freq: Every day | ORAL | 1 refills | Status: DC
  Filled 2022-09-19: qty 30, 30d supply, fill #0

## 2022-09-19 MED ORDER — PREDNISONE 20 MG PO TABS
20.0000 mg | ORAL_TABLET | Freq: Every day | ORAL | 0 refills | Status: DC
  Filled 2022-09-19: qty 5, 5d supply, fill #0

## 2022-09-19 NOTE — Patient Instructions (Signed)
Cmo usar una rodillera How to Use a Knee Brace  Una rodillera es un dispositivo que se [place] para brindar sujecin a su rodilla, especialmente si usted tiene artritis o durante un perodo de recuperacin despus de una lesin o [place]. Hay varios tipos de rodilleras. Algunas estn diseadas para evitar las lesiones (rodilleras de prevencin). A menudo se usan durante la prctica de deportes. Otras sirven para estabilizar una rodilla lesionada (rodillera funcional) o para mantenerla inmvil mientras se recupera de una lesin (rodillera para rehabilitacin). Las [place] [employer] tienen artritis grave de rodilla pueden beneficiarse con una rodillera que quita algo de presin de la rodilla (rodillera de descarga). La mayora de las rodilleras estn fabricadas con una combinacin que incluye metal o plstico. Es posible que deba usar una rodillera: Para aliviar el dolor de [place]. Para ayudar a la rodilla a Radio producer (mejorar la estabilidad). Para ayudarlo a caminar un poco ms, o moverse con ms facilidad (mejorar la movilidad). Para prevenir lesiones. Para brindar estabilidad a la rodilla mientras se recupera de una lesin o [place]. Cules son los riesgos? Generalmente, las rodilleras son seguras de Training and development officer. Sin embargo, pueden ocurrir complicaciones, por ejemplo: Irritacin de Manufacturing engineer. Esto puede causar dolor y derivar en una infeccin, en casos poco frecuentes. Agravar la afeccin si la rodillera se [place] de un modo incorrecto. Rigidez de la articulacin de la rodilla. Cmo usar [employer] diferentes rodilleras tendrn distintas instrucciones de [place]. El Buyer, retail o Forensic psychologist lo siguiente: Cmo Office manager. Cmo ajustar la rodillera. Cundo y con qu frecuencia usar la rodillera. Cmo quitarse la rodillera. Si puede apoyar peso en la pierna mientras [place] la rodillera. Si necesita dispositivos de [employer] de la rodillera, como muletas o un  bastn. En general, la rodillera: Debe tener la bisagra alineada con la flexin de la rodilla. Debe tener correas, sistemas de cierre o cintas que se ajusten bien alrededor de la pierna. No debe estar muy ajustado ni muy flojo. Cmo cuidar la rodillera Revise frecuentemente la rodillera para detectar signos de dao, por ejemplo, conexiones o accesorios sueltos. Durante el uso normal, la rodillera puede daarse o IT sales professional. [place] tela de la rodillera con agua y [place]. Deje que la rodillera se seque al aire por completo antes de usarla. Lea las instrucciones de uso que se adjuntan con la rodillera para Civil engineer, contracting otras instrucciones especficas respecto de los cuidados. Siga estas instrucciones en su casa: Si se lo indican, levante (eleve) la zona lesionada por encima del nivel del corazn cuando est sentado o acostado. Esto reduce el dolor punzante y la hinchazn, y contribuye a la curacin. Se pueden usar [employer]. Afloje la rodillera si siente hormigueo en los dedos de los pies o si nota otros sntomas o signos de que est muy ajustada, por [place], los siguientes: Hinchazn. Entumecimiento. Cambio de color del pie o del tobillo. Aumento del dolor. Comunquese con un mdico si: La rodillera est muy floja o [place], y no logra acomodarla correctamente. La rodillera se rompe o debe reemplazarla. La rodillera le produce dolor, enrojecimiento, hinchazn, moretones o irritacin en la piel. La rodillera no lo ayuda a Theme park manager. La rodillera est intensificando el dolor de rodilla. Tiene signos de que hay una infeccin en la piel debajo de la rodillera. Resumen Una rodillera es un dispositivo que se [place] para brindar sujecin a su rodilla, especialmente si usted tiene artritis o durante un perodo  de recuperacin despus de una lesin o [place]. Las diferentes rodilleras tendrn distintas instrucciones de [place]. El [employer]iacom dir o Forensic psychologist cmo usar la  rodillera. Revise frecuentemente la rodillera para detectar signos de dao, por ejemplo, conexiones o accesorios sueltos. Durante el uso normal, la rodillera puede daarse o IT sales professional. Comunquese con un mdico si la rodillera no le resulta eficaz para aliviar el problema o si le est intensificando el dolor de rodilla. Esta informacin no tiene Marine scientist el consejo del mdico. Asegrese de hacerle al mdico cualquier pregunta que tenga. Document Revised: 04/15/2021 Document Reviewed: 04/15/2021 Elsevier Patient Education  [place].

## 2022-09-19 NOTE — Progress Notes (Signed)
Pain in left knee and left leg.

## 2022-09-19 NOTE — Progress Notes (Signed)
Subjective:  Patient ID: Melinda Spears, female    DOB: 1975-07-13  Age: 47 y.o. MRN: [id number]  CC: Knee Pain   HPI Melinda Spears is a 47 y.o. year old female with a history of arthritis of the knees.  Interval History:  She Complains of bilateral knee pain, xrays in 2022 had revealed effusion and Osteoarthritis and I had referred her to Ortho but she never got to see them.Pian is 8/10 and is worse on walking and going up and down the stairs.  Left knee pain is worse than right knee pain. She has no pain medication. Past Medical History:  Diagnosis Date   Acute meniscal tear, lateral    right leg   Blood transfusion 2009   s/p kidney surgery   Hx successful VBAC (vaginal birth after cesarean), currently pregnant    Kidney calculus    right staghorn   Kidney stone 2009   Knee effusion, right    joint   Pain, dental    Varicose veins of both lower extremities     Past Surgical History:  Procedure Laterality Date   APPENDECTOMY     CESAREAN SECTION     KIDNEY STONE SURGERY  02/20/2007   repeat pcnl for remaining right kidney stone fragments   KIDNEY STONE SURGERY  03/19/2007   for futher stone fragment removal   KIDNEY SURGERY  2009   NEPHROLITHOTOMY  12/21/2006   percutaneous nephrolithotomy of right staghorn calculus- carbonate apatite    Family History  Problem Relation Age of Onset   Diabetes Father    Other Neg Hx    Breast cancer Neg Hx     Social History   Socioeconomic History   Marital status: Single    Spouse name: Not on file   Number of children: 3   Years of education: Not on file   Highest education level: 12th grade  Occupational History   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   Smoking status: Never   Smokeless tobacco: Never  Vaping Use   Vaping Use: Never used  Substance and Sexual Activity   Alcohol use: No   Drug use: No   Sexual activity: Yes    Birth control/protection: None  Other Topics Concern   Not on  file  Social History Narrative   Diet Coke one daily   Social Determinants of Health   Financial Resource Strain: Not on file  Food Insecurity: Food Insecurity Present (05/18/2022)   Hunger Vital Sign    Worried About Running Out of Food in the Last Year: Sometimes true    Ran Out of Food in the Last Year: Sometimes true  Transportation Needs: No Transportation Needs (05/18/2022)   PRAPARE - Hydrologist (Medical): No    Lack of Transportation (Non-Medical): No  Physical Activity: Not on file  Stress: Not on file  Social Connections: Not on file    No Known Allergies  Outpatient Medications Prior to Visit  Medication Sig Dispense Refill   gabapentin (NEURONTIN) 300 MG capsule TAKE 1 CAPSULE (300 MG TOTAL) BY MOUTH AT BEDTIME. 30 capsule 3   No facility-administered medications prior to visit.     ROS Review of Systems  Constitutional:  Negative for activity change and appetite change.  HENT:  Negative for sinus pressure and sore throat.   Respiratory:  Negative for chest tightness, shortness of breath and wheezing.   Cardiovascular:  Negative for chest pain and  palpitations.  Gastrointestinal:  Negative for abdominal distention, abdominal pain and constipation.  Genitourinary: Negative.   Musculoskeletal:        See HPI  Psychiatric/Behavioral:  Negative for behavioral problems and dysphoric mood.     Objective:  BP 105/69   Pulse 73   Temp 98.3 F (36.8 C) (Oral)   Ht '5\' 7"'$  (1.702 m)   Wt 164 lb 3.2 oz (74.5 kg)   SpO2 98%   BMI 25.72 kg/m      09/19/2022    2:08 PM 05/18/2022    1:18 PM 08/09/2020    3:03 PM  BP/Weight  Systolic BP 123456 [id number] [id number]  Diastolic BP 69 90 61  Wt. (Lbs) 164.2 177.3 159.8  BMI 25.72 kg/m2 27.77 kg/m2 25.03 kg/m2      Physical Exam Constitutional:      Appearance: She is well-developed.  Cardiovascular:     Rate and Rhythm: Normal rate.     Heart sounds: Normal heart sounds. No murmur  heard. Pulmonary:     Effort: Pulmonary effort is normal.     Breath sounds: Normal breath sounds. No wheezing or rales.  Chest:     Chest wall: No tenderness.  Abdominal:     General: Bowel sounds are normal. There is no distension.     Palpations: Abdomen is soft. There is no mass.     Tenderness: There is no abdominal tenderness.  Musculoskeletal:     Right lower leg: No edema.     Left lower leg: No edema.     Comments: Slight edema of both knee joints Tenderness on left medial joint line Tenderness on flexion and extension of both knee joints  Neurological:     Mental Status: She is alert and oriented to person, place, and time.  Psychiatric:        Mood and Affect: Mood normal.        Latest Ref Rng & Units 05/05/2020   11:30 AM 02/20/2013    4:11 PM 01/10/2013    8:58 AM  CMP  Glucose 65 - 99 mg/dL 92  96  95   BUN 6 - 24 mg/dL '14  13  3   '$ Creatinine 0.57 - 1.00 mg/dL 0.74  0.83  0.63   Sodium 134 - 144 mmol/L 138  139  138   Potassium 3.5 - 5.2 mmol/L 4.6  4.4  3.6   Chloride 96 - 106 mmol/L 100  104  107   CO2 20 - 29 mmol/L '26  28  23   '$ Calcium 8.7 - 10.2 mg/dL 9.7  9.2  8.3   Total Protein 6.0 - 8.5 g/dL 7.6  7.0    Total Bilirubin 0.0 - 1.2 mg/dL 0.4  0.3    Alkaline Phos 44 - 121 IU/L 64  70    AST 0 - 40 IU/L 17  14    ALT 0 - 32 IU/L 16  15      Lipid Panel  No results found for: "CHOL", "TRIG", "HDL", "CHOLHDL", "VLDL", "LDLCALC", "LDLDIRECT"  CBC    Component Value Date/Time   WBC 5.7 02/20/2013 1611   RBC 3.99 02/20/2013 1611   HGB 12.5 02/20/2013 1611   HCT 37.9 02/20/2013 1611   PLT 252 02/20/2013 1611   MCV 95.0 02/20/2013 1611   MCH 31.3 02/20/2013 1611   MCHC 33.0 02/20/2013 1611   RDW 14.0 02/20/2013 1611   LYMPHSABS 2.1 02/20/2013 1611   MONOABS 0.5 02/20/2013 1611  EOSABS 0.1 02/20/2013 1611   BASOSABS 0.0 02/20/2013 1611    Lab Results  Component Value Date   HGBA1C 5.4 05/05/2020    Assessment & Plan:  1. Bilateral  primary osteoarthritis of knee Uncontrolled Initiate meloxicam after completion of prednisone - predniSONE (DELTASONE) 20 MG tablet; Take 1 tablet (20 mg total) by mouth daily with breakfast.  Dispense: 5 tablet; Refill: 0 - CMP14+EGFR - Ambulatory referral to Orthopedic Surgery - meloxicam (MOBIC) 7.5 MG tablet; Take 1 tablet (7.5 mg total) by mouth daily.  Dispense: 30 tablet; Refill: 1   Meds ordered this encounter  Medications   predniSONE (DELTASONE) 20 MG tablet    Sig: Take 1 tablet (20 mg total) by mouth daily with breakfast.    Dispense:  5 tablet    Refill:  0   meloxicam (MOBIC) 7.5 MG tablet    Sig: Take 1 tablet (7.5 mg total) by mouth daily.    Dispense:  30 tablet    Refill:  1    Follow-up: Return in about 3 months (around 12/20/2022) for Knee pain.       Charlott Rakes, MD, FAAFP. [facility] [place], [place]   09/19/2022, 2:30 PM

## 2022-09-20 LAB — CMP14+EGFR
ALT: 14 IU/L (ref 0–32)
AST: 17 IU/L (ref 0–40)
Albumin/Globulin Ratio: 2.4 — ABNORMAL HIGH (ref 1.2–2.2)
Albumin: 4.8 g/dL (ref 3.9–4.9)
Alkaline Phosphatase: 58 IU/L (ref 44–121)
BUN/Creatinine Ratio: 9 (ref 9–23)
BUN: 6 mg/dL (ref 6–24)
Bilirubin Total: 0.4 mg/dL (ref 0.0–1.2)
CO2: 22 mmol/L (ref 20–29)
Calcium: 9.4 mg/dL (ref 8.7–10.2)
Chloride: 106 mmol/L (ref 96–106)
Creatinine, Ser: 0.68 mg/dL (ref 0.57–1.00)
Globulin, Total: 2 g/dL (ref 1.5–4.5)
Glucose: 91 mg/dL (ref 70–99)
Potassium: 4.2 mmol/L (ref 3.5–5.2)
Sodium: 141 mmol/L (ref 134–144)
Total Protein: 6.8 g/dL (ref 6.0–8.5)
eGFR: 109 mL/min/{1.73_m2} (ref >59)

## 2022-09-25 ENCOUNTER — Other Ambulatory Visit: Payer: Self-pay

## 2022-09-26 ENCOUNTER — Ambulatory Visit: Payer: Self-pay | Admitting: Surgical

## 2022-09-28 ENCOUNTER — Ambulatory Visit: Payer: Self-pay | Attending: Family Medicine

## 2022-10-02 ENCOUNTER — Ambulatory Visit: Payer: Self-pay | Admitting: Surgical

## 2022-10-16 ENCOUNTER — Ambulatory Visit (INDEPENDENT_AMBULATORY_CARE_PROVIDER_SITE_OTHER): Payer: Self-pay | Admitting: Surgical

## 2022-10-16 ENCOUNTER — Other Ambulatory Visit (INDEPENDENT_AMBULATORY_CARE_PROVIDER_SITE_OTHER): Payer: Self-pay

## 2022-10-16 DIAGNOSIS — M170 Bilateral primary osteoarthritis of knee: Secondary | ICD-10-CM

## 2022-10-16 DIAGNOSIS — G8929 Other chronic pain: Secondary | ICD-10-CM

## 2022-10-16 DIAGNOSIS — M25561 Pain in right knee: Secondary | ICD-10-CM

## 2022-10-16 DIAGNOSIS — M25562 Pain in left knee: Secondary | ICD-10-CM

## 2022-10-17 ENCOUNTER — Encounter: Payer: Self-pay | Admitting: Surgical

## 2022-10-17 MED ORDER — BUPIVACAINE HCL 0.25 % IJ SOLN
4.0000 mL | INTRAMUSCULAR | Status: AC | PRN
  Administered 2022-10-16: 4 mL via INTRA_ARTICULAR

## 2022-10-17 MED ORDER — LIDOCAINE HCL 1 % IJ SOLN
5.0000 mL | INTRAMUSCULAR | Status: AC | PRN
  Administered 2022-10-16: 5 mL

## 2022-10-17 MED ORDER — METHYLPREDNISOLONE ACETATE 40 MG/ML IJ SUSP
40.0000 mg | INTRAMUSCULAR | Status: AC | PRN
  Administered 2022-10-16: 40 mg via INTRA_ARTICULAR

## 2022-10-17 NOTE — Progress Notes (Signed)
Office Visit Note   Patient: Melinda Spears           Date of Birth: [birth year]           MRN: [id number] Visit Date: 10/16/2022 Requested by: Hoy Register, MD [address] [place],  [place] [address] PCP: Hoy Register, MD  Subjective: Chief Complaint  Patient presents with   Right Knee - Pain   Left Knee - Pain    HPI: Melinda Spears is a 47 y.o. female who presents to the office reporting bilateral knee pain.  Patient notes she has had pain for greater than 10 years.  She denies any history of known injury.  Reports primarily medial sided knee pain.  She works as a Advertising copywriter.  Pain will wake her up at night at times.  She states that pain is worse with going up and down stairs.  She has associated popping in the knees.  Left knee bothers her more than the right knee.  She is taking meloxicam from her primary care provider.  No treatment really has helped recently but she has had prior injections about greater than 3 years ago that did provide good relief for her.  She has no history of prior surgery to either knee.  No groin pain.  No radicular pain.  She has no major medical problems and specifically denies any history of diabetes or smoking..                ROS: All systems reviewed are negative as they relate to the chief complaint within the history of present illness.  Patient denies fevers or chills.  Assessment & Plan: Visit Diagnoses:  1. Bilateral primary osteoarthritis of knee   2. Chronic pain of both knees     Plan: Patient is a 47 year old female who presents for evaluation of bilateral knee pain.  She has had knee pain for more than 10 years with radiographs taken today demonstrating moderate arthritis of the knees which are definitely more advanced than her age of 41.  This she states that she has had prior injections with good relief of her knee pain.  She is not considering surgery at this time and I cautioned her against surgery if  she can do without it due to how young she is.  We discussed options available patient including trying different anti-inflammatory medication versus physical therapy versus cortisone injection versus gel injections versus knee replacement.  She would like to try bilateral knee cortisone injections today.  No history of diabetes.  She tolerated both injections well.  Follow-up in 6 weeks for clinical recheck with Dr. August Saucer.  Follow-Up Instructions: No follow-ups on file.   Orders:  Orders Placed This Encounter  Procedures   XR KNEE 3 VIEW RIGHT   XR KNEE 3 VIEW LEFT   No orders of the defined types were placed in this encounter.     Procedures: Large Joint Inj: bilateral knee on 10/16/2022 2:43 PM Indications: diagnostic evaluation, joint swelling and pain Details: 18 G 1.5 in needle, superolateral approach  Arthrogram: No  Medications (Right): 5 mL lidocaine 1 %; 4 mL bupivacaine 0.25 %; 40 mg methylPREDNISolone acetate 40 MG/ML Medications (Left): 5 mL lidocaine 1 %; 4 mL bupivacaine 0.25 %; 40 mg methylPREDNISolone acetate 40 MG/ML Outcome: tolerated well, no immediate complications Procedure, treatment alternatives, risks and benefits explained, specific risks discussed. Consent was given by the patient. Immediately prior to procedure a time out was  called to verify the correct patient, procedure, equipment, support staff and site/side marked as required. Patient was prepped and draped in the usual sterile fashion.       Clinical Data: No additional findings.  Objective: Vital Signs: There were no vitals taken for this visit.  Physical Exam:  Constitutional: Patient appears well-developed HEENT:  Head: Normocephalic Eyes:EOM are normal Neck: Normal range of motion Cardiovascular: Normal rate Pulmonary/chest: Effort normal Neurologic: Patient is alert Skin: Skin is warm Psychiatric: Patient has normal mood and affect  Ortho Exam: Ortho exam demonstrates bilateral knees  with 0 degrees extension and 120 degrees of knee flexion.  She has no cellulitis or skin changes noted.  Able to perform straight leg raise.  Stable to anterior posterior drawer sign bilaterally.  She has no calf tenderness.  Negative Homans' sign bilaterally.  No pain with hip range of motion bilaterally.  Tender over the medial joint line moderately and lateral joint line mildly bilaterally.  Specialty Comments:  No specialty comments available.  Imaging: No results found.   PMFS History: Patient Active Problem List   Diagnosis Date Noted   Pap smear for cervical cancer screening 05/05/2019   Screening breast examination 04/03/2019   Diabetes mellitus screening 12/28/2016   ESBL (extended spectrum beta-lactamase) producing bacteria infection 01/10/2013   Acute pyelonephritis 01/10/2013   Gram negative septicemia 12/03/2012   Renal cyst 12/03/2012   Adnexal cyst 12/03/2012   Flank pain 12/02/2012   Pyelonephritis 12/02/2012   Sepsis 12/02/2012   Hypokalemia 12/02/2012   Past Medical History:  Diagnosis Date   Acute meniscal tear, lateral    right leg   Blood transfusion 2009   s/p kidney surgery   Hx successful VBAC (vaginal birth after cesarean), currently pregnant    Kidney calculus    right staghorn   Kidney stone 2009   Knee effusion, right    joint   Pain, dental    Varicose veins of both lower extremities     Family History  Problem Relation Age of Onset   Diabetes Father    Other Neg Hx    Breast cancer Neg Hx     Past Surgical History:  Procedure Laterality Date   APPENDECTOMY     CESAREAN SECTION     KIDNEY STONE SURGERY  02/20/2007   repeat pcnl for remaining right kidney stone fragments   KIDNEY STONE SURGERY  03/19/2007   for futher stone fragment removal   KIDNEY SURGERY  2009   NEPHROLITHOTOMY  12/21/2006   percutaneous nephrolithotomy of right staghorn calculus- carbonate apatite   Social History   Occupational History   Occupation: Cook at  [employer]  Tobacco Use   Smoking status: Never   Smokeless tobacco: Never  Vaping Use   Vaping Use: Never used  Substance and Sexual Activity   Alcohol use: No   Drug use: No   Sexual activity: Yes    Birth control/protection: None

## 2022-10-23 ENCOUNTER — Telehealth: Payer: Self-pay

## 2022-10-23 NOTE — Telephone Encounter (Signed)
Auth needed for bilat knee gel  

## 2022-10-24 NOTE — Telephone Encounter (Signed)
Mailed [employer] application to patient's address listed for Monovisc, bilateral knee due patient not having any insurance.

## 2022-11-24 ENCOUNTER — Encounter: Payer: Self-pay | Admitting: Surgical

## 2022-11-24 ENCOUNTER — Ambulatory Visit (INDEPENDENT_AMBULATORY_CARE_PROVIDER_SITE_OTHER): Payer: Self-pay | Admitting: Surgical

## 2022-11-24 ENCOUNTER — Other Ambulatory Visit (INDEPENDENT_AMBULATORY_CARE_PROVIDER_SITE_OTHER): Payer: Self-pay

## 2022-11-24 DIAGNOSIS — M542 Cervicalgia: Secondary | ICD-10-CM

## 2022-11-24 DIAGNOSIS — M792 Neuralgia and neuritis, unspecified: Secondary | ICD-10-CM

## 2022-11-26 ENCOUNTER — Encounter: Payer: Self-pay | Admitting: Surgical

## 2022-11-26 NOTE — Progress Notes (Signed)
Office Visit Note   Patient: Melinda Spears           Date of Birth: 1975-11-12           MRN: [id number] Visit Date: 11/24/2022 Requested by: Hoy Register, MD [address] [place] [place],  [place] [address] PCP: Hoy Register, MD  Subjective: Chief Complaint  Patient presents with   Right Knee - Follow-up   Left Knee - Follow-up    HPI: Melinda Spears is a 47 y.o. female who presents to the office reporting right shoulder and arm pain.  Patient states that she has had years of pain in this arm.  Localizes pain to the trapezius region.  She has occasional diffuse pain throughout the shoulder as well.  She does have daily radicular pain that travels down the entire length of the right arm into the hand with associated numbness and tingling of the entire hand both the dorsal and palmar aspects.  No scapular pain.  Does have occasional neck pain.  Does feel weakness in her hand at times and drops objects.  She has never had this evaluated before.  No prior history of surgery to her neck or shoulder.  Moving her neck causes worse pain.  She has no change with coughing, laughing etc.  She has tried exercises at home without any significant relief of both her shoulder and neck..                ROS: All systems reviewed are negative as they relate to the chief complaint within the history of present illness.  Patient denies fevers or chills.  Assessment & Plan: Visit Diagnoses:  1. Radicular pain in right arm   2. Neck pain     Plan: Patient is a 47 year old female who presents for evaluation of right radicular arm pain.  She has had this pain for several years and it bothers her at least once per day.  She has associated numbness and tingling in the dorsal and palmar aspect of the right hand.  No left arm symptoms.  Never had history of any surgery to her neck or shoulder.  Seems that based on her distribution of symptoms, this is more likely related to cervical  pathology rather than shoulder pathology, especially with no tenderness throughout the shoulder or any pain with passive motion of the shoulder.  Cervical spine radiographs demonstrate loss of lordosis but no other significant changes in the cervical spine.  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  Follow-up after MRI to review results.  As an aside, her knees are doing a lot better after her cortisone injections on 10/16/2022.  She is satisfied with how her knees are doing.  She was not approved for gel injections but she was made aware that she will have forms sent to her house to see if she can get these free from the company.  Follow-Up Instructions: No follow-ups on file.   Orders:  Orders Placed This Encounter  Procedures   XR Cervical Spine 2 or 3 views   No orders of the defined types were placed in this encounter.     Procedures: No procedures performed   Clinical Data: No additional findings.  Objective: Vital Signs: There were no vitals taken for this visit.  Physical Exam:  Constitutional: Patient appears well-developed HEENT:  Head: Normocephalic Eyes:EOM are normal Neck: Normal range  of motion Cardiovascular: Normal rate Pulmonary/chest: Effort normal Neurologic: Patient is alert Skin: Skin is warm Psychiatric: Patient has normal mood and affect  Ortho Exam: Ortho exam demonstrates left shoulder with 80 degrees external rotation, 100 degrees abduction, 170 degrees forward elevation passively and actively.  This compared with the right shoulder with 85 degrees external rotation, 110 degrees abduction, 180 degrees forward elevation passively and actively.  Excellent rotator cuff strength of supra, infra, subscap.  No weakness of grip strength, finger abduction, EPL, FPL, pronation/supination, bicep, tricep, deltoid.  Mild tenderness throughout the axial cervical spine.  Negative Spurling  sign.  Negative Lhermitte sign.  No tenderness over the bicipital groove.  No tenderness over the [facility] joint.  No pain with passive motion of the shoulder.  Specialty Comments:  No specialty comments available.  Imaging: No results found.   PMFS History: Patient Active Problem List   Diagnosis Date Noted   Pap smear for cervical cancer screening 05/05/2019   Screening breast examination 04/03/2019   Diabetes mellitus screening 12/28/2016   ESBL (extended spectrum beta-lactamase) producing bacteria infection 01/10/2013   Acute pyelonephritis 01/10/2013   Gram negative septicemia (HCC) 12/03/2012   Renal cyst 12/03/2012   Adnexal cyst 12/03/2012   Flank pain 12/02/2012   Pyelonephritis 12/02/2012   Sepsis (HCC) 12/02/2012   Hypokalemia 12/02/2012   Past Medical History:  Diagnosis Date   Acute meniscal tear, lateral    right leg   Blood transfusion 2009   s/p kidney surgery   Hx successful VBAC (vaginal birth after cesarean), currently pregnant    Kidney calculus    right staghorn   Kidney stone 2009   Knee effusion, right    joint   Pain, dental    Varicose veins of both lower extremities     Family History  Problem Relation Age of Onset   Diabetes Father    Other Neg Hx    Breast cancer Neg Hx     Past Surgical History:  Procedure Laterality Date   APPENDECTOMY     CESAREAN SECTION     KIDNEY STONE SURGERY  02/20/2007   repeat pcnl for remaining right kidney stone fragments   KIDNEY STONE SURGERY  03/19/2007   for futher stone fragment removal   KIDNEY SURGERY  2009   NEPHROLITHOTOMY  12/21/2006   percutaneous nephrolithotomy of right staghorn calculus- carbonate apatite   Social History   Occupational History   Occupation: Cook at [employer]  Tobacco Use   Smoking status: Never   Smokeless tobacco: Never  Vaping Use   Vaping Use: Never used  Substance and Sexual Activity   Alcohol use: No   Drug use: No   Sexual activity: Yes    Birth  control/protection: None

## 2022-11-27 NOTE — Addendum Note (Signed)
Addended by: Rogers Seeds on: 11/27/2022 03:23 PM   Modules accepted: Orders

## 2022-12-17 ENCOUNTER — Other Ambulatory Visit: Payer: Self-pay

## 2022-12-26 ENCOUNTER — Ambulatory Visit: Payer: Self-pay | Admitting: Family Medicine

## 2023-01-01 ENCOUNTER — Ambulatory Visit: Payer: Self-pay | Admitting: Family Medicine

## 2023-01-16 ENCOUNTER — Ambulatory Visit: Payer: Self-pay | Admitting: Family Medicine

## 2023-02-05 ENCOUNTER — Ambulatory Visit: Payer: Self-pay | Attending: Family Medicine | Admitting: Family Medicine

## 2023-02-05 ENCOUNTER — Other Ambulatory Visit: Payer: Self-pay

## 2023-02-05 VITALS — BP 136/84 | HR 68 | Temp 98.2°F | Ht 67.0 in | Wt 177.6 lb

## 2023-02-05 DIAGNOSIS — Z1211 Encounter for screening for malignant neoplasm of colon: Secondary | ICD-10-CM

## 2023-02-05 DIAGNOSIS — M940 Chondrocostal junction syndrome [Tietze]: Secondary | ICD-10-CM

## 2023-02-05 DIAGNOSIS — R079 Chest pain, unspecified: Secondary | ICD-10-CM | POA: Insufficient documentation

## 2023-02-05 MED ORDER — NAPROXEN 500 MG PO TABS
500.0000 mg | ORAL_TABLET | Freq: Two times a day (BID) | ORAL | 0 refills | Status: DC
  Filled 2023-02-05: qty 60, 30d supply, fill #0

## 2023-02-05 NOTE — Patient Instructions (Signed)
Costocondritis Costochondritis  La costocondritis es la irritacin e hinchazn (inflamacin) del tejido que une las costillas con el esternn. Este tejido se Consulting civil engineer. Esta afeccin causa dolor en la parte frontal del pecho. El dolor suele comenzar lentamente. Puede estar presente en ms de una costilla. Cules son las causas? No siempre se conoce la causa de esta afeccin. Puede deberse a sobrecarga en el esternn. La causa de esta sobrecarga puede ser la siguiente: Lesin torcica. Ejercicio o actividad. Esto puede Dealer. Tos muy intensa. Qu incrementa el riesgo? Ser mujer. Tener entre 30 y 40 aos. Comenzar un nuevo ejercicio o una nueva actividad laboral. Wilburt Finlay niveles bajos de vitamina D. Tener una afeccin que le haga toser [place]. Cules son los signos o sntomas? Dolor en el pecho con las siguientes caractersticas: Comienza lentamente. Puede ser agudo o sordo. Empeora al respirar, toser o hacer ejercicio. Mejora con el reposo. Puede empeorar al presionar las costillas y el esternn. Cmo se trata? En la [employer], [place] afeccin desaparece sola con el [place]. Es posible que deba tomar antiinflamatorios no esteroideos (AINE), como ibuprofeno. Esto puede ayudar a Glass blower/designer. Podra tambin necesitar: Hacer reposo y [employer] de las actividades que empeoren Chief Technology Officer. Aplicar calor o hielo en la zona que duele. Hacer ejercicios para elongar los msculos del trax. Si estos tratamientos no ayudan, el mdico puede inyectarle un medicamento para adormecer la zona. Esto puede ayudar a Engineer, materials. Siga estas instrucciones en su casa: Control del dolor, la rigidez y la hinchazn     Si se lo indican, aplique hielo sobre la zona dolorida. Para hacer esto: Ponga el hielo en una bolsa plstica. Coloque una toalla entre la piel y Copy. Aplique el hielo durante 20 minutos, 2 a 3 veces por da. Si se lo indican, aplique calor  en la zona afectada. Hgalo con la frecuencia que le haya indicado el mdico. Use la fuente de calor que el mdico le recomiende, como una compresa de calor hmedo o una almohadilla trmica. Coloque una toalla entre la piel y la fuente de Airline pilot. Aplique calor durante 20 a 30 minutos. Si la piel se le pone de color rojo brillante, quite el hielo o el calor de inmediato para evitar daos en la piel. El riesgo de dao en la piel es mayor si no puede sentir dolor, calor o fro. Actividad Haga reposo como se lo haya indicado el mdico. No haga cosas que Warden/ranger. Esto incluye cualquier [employer] del trax, el vientre (abdomen) y los laterales. Es posible que Personnel officer objetos. Pregntele a su mdico cunto Database administrator con seguridad. Retome sus actividades normales cuando el mdico le diga que es seguro. Instrucciones generales [employer] de venta libre y los recetados solamente como se lo haya indicado el mdico. Comunquese con un mdico si: Siente escalofros o tiene fiebre. El dolor no desaparece o empeora. Tiene tos que no desaparece. Solicite ayuda de inmediato si: Tiene dificultad para respirar. Tiene dolor muy intenso en el trax que no mejora con medicamentos, con hielo o con calor. Estos sntomas pueden Customer service manager. Solicite ayuda de inmediato. Llame al 911. No espere a ver si los sntomas desaparecen. No conduzca por sus propios medios [employer]. Esta informacin no tiene Theme park manager el consejo del mdico. Asegrese de hacerle al mdico cualquier pregunta que tenga. Document Revised: 02/04/2022 Document Reviewed: 02/04/2022 Elsevier Patient Education  2024 Elsevier Inc.

## 2023-02-05 NOTE — Progress Notes (Signed)
Subjective:  Patient ID: Melinda Spears, female    DOB: June 14, 1976  Age: 47 y.o. MRN: [id number]  CC: Chest Pain   HPI Virgin Greisen is a 47 y.o. year old female with a history of history of thyroid disease of the knees.  Interval History: Discussed the use of AI scribe software for clinical note transcription with the patient, who gave verbal consent to proceed.  She presents with chest pain of several days duration. The pain, initially mild, has increased in intensity over the past three days. The pain is located in the left chest and is exacerbated by movement and deep inspiration. She denies associated dyspnea, diaphoresis, or cough. The pain is currently rated as a 10/10. She has tried Tylenol, which provided minimal relief when the pain was less severe. She denies acid reflux symptoms.        Past Medical History:  Diagnosis Date   Acute meniscal tear, lateral    right leg   Blood transfusion 2009   s/p kidney surgery   Hx successful VBAC (vaginal birth after cesarean), currently pregnant    Kidney calculus    right staghorn   Kidney stone 2009   Knee effusion, right    joint   Pain, dental    Varicose veins of both lower extremities     Past Surgical History:  Procedure Laterality Date   APPENDECTOMY     CESAREAN SECTION     KIDNEY STONE SURGERY  02/20/2007   repeat pcnl for remaining right kidney stone fragments   KIDNEY STONE SURGERY  03/19/2007   for futher stone fragment removal   KIDNEY SURGERY  2009   NEPHROLITHOTOMY  12/21/2006   percutaneous nephrolithotomy of right staghorn calculus- carbonate apatite    Family History  Problem Relation Age of Onset   Diabetes Father    Other Neg Hx    Breast cancer Neg Hx     Social History   Socioeconomic History   Marital status: Single    Spouse name: Not on file   Number of children: 3   Years of education: Not on file   Highest education level: 12th grade  Occupational History    Occupation: Cook at [employer]  Tobacco Use   Smoking status: Never   Smokeless tobacco: Never  Vaping Use   Vaping status: Never Used  Substance and Sexual Activity   Alcohol use: No   Drug use: No   Sexual activity: Yes    Birth control/protection: None  Other Topics Concern   Not on file  Social History Narrative   Diet Coke one daily   Social Determinants of Health   Financial Resource Strain: Not on file  Food Insecurity: Food Insecurity Present (05/18/2022)   Hunger Vital Sign    Worried About Running Out of Food in the Last Year: Sometimes true    Ran Out of Food in the Last Year: Sometimes true  Transportation Needs: No Transportation Needs (05/18/2022)   PRAPARE - Administrator, Civil Service (Medical): No    Lack of Transportation (Non-Medical): No  Physical Activity: Not on file  Stress: Not on file  Social Connections: Not on file    No Known Allergies  Outpatient Medications Prior to Visit  Medication Sig Dispense Refill   gabapentin (NEURONTIN) 300 MG capsule TAKE 1 CAPSULE (300 MG TOTAL) BY MOUTH AT BEDTIME. 30 capsule 3   predniSONE (DELTASONE) 20 MG tablet Take 1 tablet (20 mg total)  by mouth daily with breakfast. (Patient not taking: Reported on 02/05/2023) 5 tablet 0   meloxicam (MOBIC) 7.5 MG tablet Take 1 tablet (7.5 mg total) by mouth daily. (Patient not taking: Reported on 02/05/2023) 30 tablet 1   No facility-administered medications prior to visit.     ROS Review of Systems  Constitutional:  Negative for activity change and appetite change.  HENT:  Negative for sinus pressure and sore throat.   Respiratory:  Negative for shortness of breath and wheezing.   Cardiovascular:  Positive for chest pain. Negative for palpitations.  Gastrointestinal:  Negative for abdominal distention, abdominal pain and constipation.  Genitourinary: Negative.   Musculoskeletal: Negative.   Psychiatric/Behavioral:  Negative for behavioral problems and  dysphoric mood.     Objective:  BP 136/84   Pulse 68   Temp 98.2 F (36.8 C) (Oral)   Ht 5\' 7"  (1.702 m)   Wt 177 lb 9.6 oz (80.6 kg)   SpO2 100%   BMI 27.82 kg/m      02/05/2023    3:48 PM 09/19/2022    2:08 PM 05/18/2022    1:18 PM  BP/Weight  Systolic BP 136 105 150  Diastolic BP 84 69 90  Wt. (Lbs) 177.6 164.2 177.3  BMI 27.82 kg/m2 25.72 kg/m2 27.77 kg/m2      Physical Exam Constitutional:      Appearance: She is well-developed.  Cardiovascular:     Rate and Rhythm: Normal rate.     Heart sounds: Normal heart sounds. No murmur heard. Pulmonary:     Effort: Pulmonary effort is normal.     Breath sounds: Normal breath sounds. No wheezing or rales.  Chest:     Chest wall: No tenderness.  Abdominal:     General: Bowel sounds are normal. There is no distension.     Palpations: Abdomen is soft. There is no mass.     Tenderness: There is no abdominal tenderness.  Musculoskeletal:        General: Normal range of motion.     Right lower leg: No edema.     Left lower leg: No edema.  Neurological:     Mental Status: She is alert and oriented to person, place, and time.  Psychiatric:        Mood and Affect: Mood normal.        Latest Ref Rng & Units 09/19/2022    2:35 PM 05/05/2020   11:30 AM 02/20/2013    4:11 PM  CMP  Glucose 70 - 99 mg/dL 91  92  96   BUN 6 - 24 mg/dL 6  14  13    Creatinine 0.57 - 1.00 mg/dL 6.96  2.95  2.84   Sodium 134 - 144 mmol/L 141  138  139   Potassium 3.5 - 5.2 mmol/L 4.2  4.6  4.4   Chloride 96 - 106 mmol/L 106  100  104   CO2 20 - 29 mmol/L 22  26  28    Calcium 8.7 - 10.2 mg/dL 9.4  9.7  9.2   Total Protein 6.0 - 8.5 g/dL 6.8  7.6  7.0   Total Bilirubin 0.0 - 1.2 mg/dL 0.4  0.4  0.3   Alkaline Phos 44 - 121 IU/L 58  64  70   AST 0 - 40 IU/L 17  17  14    ALT 0 - 32 IU/L 14  16  15      Lipid Panel  No results found for: "CHOL", "TRIG", "HDL", "  CHOLHDL", "VLDL", "LDLCALC", "LDLDIRECT"  CBC    Component Value Date/Time    WBC 5.7 02/20/2013 1611   RBC 3.99 02/20/2013 1611   HGB 12.5 02/20/2013 1611   HCT 37.9 02/20/2013 1611   PLT 252 02/20/2013 1611   MCV 95.0 02/20/2013 1611   MCH 31.3 02/20/2013 1611   MCHC 33.0 02/20/2013 1611   RDW 14.0 02/20/2013 1611   LYMPHSABS 2.1 02/20/2013 1611   MONOABS 0.5 02/20/2013 1611   EOSABS 0.1 02/20/2013 1611   BASOSABS 0.0 02/20/2013 1611    Lab Results  Component Value Date   HGBA1C 5.4 05/05/2020    Assessment & Plan:      Chest Pain: Chest pain for several days, worse with exertion and deep breathing. No associated shortness of breath or sweating. Pain is deep and not reproducible with palpation.  Her presentation is not in keeping with reported pain of 10/10. EKG normal. Possible costochondritis. -Order chest X-ray to evaluate for other causes. -Recommend over-the-counter anti-inflammatory medication such as Aleve or Naproxen for pain relief. -Consider use of aspirin. -If pain persists, patient should seek emergency care.           Meds ordered this encounter  Medications   naproxen (NAPROSYN) 500 MG tablet    Sig: Take 1 tablet (500 mg total) by mouth 2 (two) times daily with a meal.    Dispense:  60 tablet    Refill:  0    Follow-up: Return if symptoms worsen or fail to improve.       Hoy Register, MD, FAAFP. [facility] [place], [place] [phone]   02/05/2023, 4:26 PM

## 2023-02-06 ENCOUNTER — Emergency Department (HOSPITAL_COMMUNITY)
Admission: EM | Admit: 2023-02-06 | Discharge: 2023-02-06 | Discharge status: Against Medical Advice | Payer: Self-pay | Attending: Emergency Medicine | Admitting: Emergency Medicine

## 2023-02-06 ENCOUNTER — Encounter (HOSPITAL_COMMUNITY): Payer: Self-pay

## 2023-02-06 ENCOUNTER — Other Ambulatory Visit: Payer: Self-pay

## 2023-02-06 ENCOUNTER — Emergency Department (HOSPITAL_COMMUNITY): Payer: Self-pay

## 2023-02-06 DIAGNOSIS — Z5321 Procedure and treatment not carried out due to patient leaving prior to being seen by health care provider: Secondary | ICD-10-CM | POA: Insufficient documentation

## 2023-02-06 DIAGNOSIS — R079 Chest pain, unspecified: Secondary | ICD-10-CM | POA: Insufficient documentation

## 2023-02-06 LAB — BASIC METABOLIC PANEL
Anion gap: 10 (ref 5–15)
BUN: 12 mg/dL (ref 6–20)
CO2: 23 mmol/L (ref 22–32)
Calcium: 9 mg/dL (ref 8.9–10.3)
Chloride: 105 mmol/L (ref 98–111)
Creatinine, Ser: 0.66 mg/dL (ref 0.44–1.00)
GFR, Estimated: >60 mL/min (ref >60)
Glucose, Bld: 107 mg/dL — ABNORMAL HIGH (ref 70–99)
Potassium: 4.1 mmol/L (ref 3.5–5.1)
Sodium: 138 mmol/L (ref 135–145)

## 2023-02-06 LAB — CBC
HCT: 39.9 % (ref 36.0–46.0)
Hemoglobin: 13.2 g/dL (ref 12.0–15.0)
MCH: 33.2 pg (ref 26.0–34.0)
MCHC: 33.1 g/dL (ref 30.0–36.0)
MCV: 100.3 fL — ABNORMAL HIGH (ref 80.0–100.0)
Platelets: 223 10*3/uL (ref 150–400)
RBC: 3.98 MIL/uL (ref 3.87–5.11)
RDW: 12.2 % (ref 11.5–15.5)
WBC: 4.8 10*3/uL (ref 4.0–10.5)
nRBC: 0 % (ref 0.0–0.2)

## 2023-02-06 LAB — TROPONIN I (HIGH SENSITIVITY)
Troponin I (High Sensitivity): 3 ng/L (ref <18)
Troponin I (High Sensitivity): 3 ng/L (ref <18)

## 2023-02-06 LAB — HCG, QUANTITATIVE, PREGNANCY: hCG, Beta Chain, Quant, S: 1 m[IU]/mL (ref <5)

## 2023-02-06 NOTE — ED Provider Triage Note (Signed)
Emergency Medicine Provider Triage Evaluation Note  Melinda Spears , a 47 y.o. female  was evaluated in triage.  Pt complains of chest pain.  Review of Systems  Positive:  Negative:   Physical Exam  There were no vitals taken for this visit. Gen:   Awake, no distress   Resp:  Normal effort  MSK:   Moves extremities without difficulty  Other:    Medical Decision Making  Medically screening exam initiated at 1:52 PM.  Appropriate orders placed.  Melinda Spears was informed that the remainder of the evaluation will be completed by another provider, this initial triage assessment does not replace that evaluation, and the importance of remaining in the ED until their evaluation is complete.  Presents for left sided chest pain yesterday. Patient saw PCP yesterday who supposedly told patient that she was going to obtain an Echo, but educated patient to seek emergency care if chest pain persisted. Patient states that she has not had chest pain since. Presented to ED because she did not know where she was supposed to get imaging done.   Melinda Spears F, [place] 02/06/23 [phone]

## 2023-02-06 NOTE — ED Triage Notes (Signed)
Pt reports chest pain yesterday. Saw her PCP yesterday and was told to come here for further evaluation.

## 2023-02-06 NOTE — ED Notes (Signed)
Patient informed this technician that they would be leaving, despite being advised not to. Dragging OTF.

## 2023-12-10 ENCOUNTER — Ambulatory Visit: Payer: Self-pay

## 2023-12-10 ENCOUNTER — Encounter: Payer: Self-pay | Admitting: Nurse Practitioner

## 2023-12-10 ENCOUNTER — Other Ambulatory Visit (HOSPITAL_COMMUNITY)
Admission: RE | Admit: 2023-12-10 | Discharge: 2023-12-10 | Disposition: A | Discharge status: Home / Self Care | Payer: Self-pay | Source: Ambulatory Visit | Attending: Nurse Practitioner | Admitting: Nurse Practitioner

## 2023-12-10 ENCOUNTER — Ambulatory Visit: Payer: Self-pay | Attending: Nurse Practitioner | Admitting: Nurse Practitioner

## 2023-12-10 ENCOUNTER — Other Ambulatory Visit: Payer: Self-pay

## 2023-12-10 VITALS — BP 97/61 | HR 81 | Temp 98.9°F | Resp 19 | Ht 67.0 in | Wt 164.6 lb

## 2023-12-10 DIAGNOSIS — R399 Unspecified symptoms and signs involving the genitourinary system: Secondary | ICD-10-CM | POA: Insufficient documentation

## 2023-12-10 LAB — POCT URINALYSIS DIP (CLINITEK)
Glucose, UA: 100 mg/dL — AB
Nitrite, UA: POSITIVE — AB
POC PROTEIN,UA: >=300 — AB
Spec Grav, UA: <=1.005 — AB (ref 1.010–1.025)
Urobilinogen, UA: 2 U/dL — AB
pH, UA: 5 (ref 5.0–8.0)

## 2023-12-10 MED ORDER — NITROFURANTOIN MONOHYD MACRO 100 MG PO CAPS
100.0000 mg | ORAL_CAPSULE | Freq: Two times a day (BID) | ORAL | 0 refills | Status: AC
  Filled 2023-12-10: qty 10, 5d supply, fill #0

## 2023-12-10 NOTE — Progress Notes (Signed)
 Assessment & Plan:   Melinda Spears was seen today for uti symptoms.  Diagnoses and all orders for this visit:  UTI symptoms -     Cervicovaginal ancillary only -     POCT URINALYSIS DIP (CLINITEK) -     nitrofurantoin, macrocrystal-monohydrate, (MACROBID) 100 MG capsule; Take 1 capsule (100 mg total) by mouth 2 (two) times daily for 5 days.    Patient has been counseled on age-appropriate routine health concerns for screening and prevention. These are reviewed and up-to-date. Referrals have been placed accordingly. Immunizations are up-to-date or declined.    Subjective:   Chief Complaint  Patient presents with   UTI SYMPTOMS    Melinda Spears 48 y.o. female presents to office today for UTI symptoms  VRI was used to communicate directly with patient for the entire encounter including providing detailed patient instructions.    She is currently experiencing dysuria, low back pain, dark yellow urine, chills, left flank pain. She denies N/V.    Review of Systems  Constitutional:  Positive for chills. Negative for fever, malaise/fatigue and weight loss.  HENT: Negative.  Negative for nosebleeds.   Eyes: Negative.  Negative for blurred vision, double vision and photophobia.  Respiratory: Negative.  Negative for cough and shortness of breath.   Cardiovascular: Negative.  Negative for chest pain, palpitations and leg swelling.  Gastrointestinal: Negative.  Negative for heartburn, nausea and vomiting.  Genitourinary:  Positive for dysuria and flank pain. Negative for frequency, hematuria and urgency.       SEE HPI  Musculoskeletal:  Positive for back pain. Negative for myalgias.  Neurological: Negative.  Negative for dizziness, focal weakness, seizures and headaches.  Psychiatric/Behavioral: Negative.  Negative for suicidal ideas.     Past Medical History:  Diagnosis Date   Acute meniscal tear, lateral    right leg   Blood transfusion 2009   s/p kidney surgery   Hx  successful VBAC (vaginal birth after cesarean), currently pregnant    Kidney calculus    right staghorn   Kidney stone 2009   Knee effusion, right    joint   Pain, dental    Varicose veins of both lower extremities     Past Surgical History:  Procedure Laterality Date   APPENDECTOMY     CESAREAN SECTION     KIDNEY STONE SURGERY  02/20/2007   repeat pcnl for remaining right kidney stone fragments   KIDNEY STONE SURGERY  03/19/2007   for futher stone fragment removal   KIDNEY SURGERY  2009   NEPHROLITHOTOMY  12/21/2006   percutaneous nephrolithotomy of right staghorn calculus- carbonate apatite    Family History  Problem Relation Age of Onset   Diabetes Father    Other Neg Hx    Breast cancer Neg Hx     Social History Reviewed with no changes to be made today.   Outpatient Medications Prior to Visit  Medication Sig Dispense Refill   ibuprofen  (ADVIL ) 200 MG tablet Take 200 mg by mouth every 6 (six) hours as needed. 5 tabs as needed     gabapentin  (NEURONTIN ) 300 MG capsule TAKE 1 CAPSULE (300 MG TOTAL) BY MOUTH AT BEDTIME. (Patient not taking: Reported on 12/10/2023) 30 capsule 3   naproxen  (NAPROSYN ) 500 MG tablet Take 1 tablet (500 mg total) by mouth 2 (two) times daily with a meal. (Patient not taking: Reported on 12/10/2023) 60 tablet 0   predniSONE  (DELTASONE ) 20 MG tablet Take 1 tablet (20 mg total) by mouth  daily with breakfast. (Patient not taking: Reported on 12/10/2023) 5 tablet 0   No facility-administered medications prior to visit.    No Known Allergies     Objective:    BP 97/61 (BP Location: Left Arm, Patient Position: Sitting, Cuff Size: Large)   Pulse 81   Temp 98.9 F (37.2 C) (Oral)   Resp 19   Ht 5\' 7"  (1.702 m)   Wt 164 lb 9.6 oz (74.7 kg)   LMP 09/09/2023 (Approximate)   SpO2 99%   BMI 25.78 kg/m  Wt Readings from Last 3 Encounters:  12/10/23 164 lb 9.6 oz (74.7 kg)  02/05/23 177 lb 9.6 oz (80.6 kg)  09/19/22 164 lb 3.2 oz (74.5 kg)     Physical Exam Vitals and nursing note reviewed.  Constitutional:      Appearance: She is well-developed.  HENT:     Head: Normocephalic and atraumatic.  Cardiovascular:     Rate and Rhythm: Normal rate and regular rhythm.     Heart sounds: Normal heart sounds. No murmur heard.    No friction rub. No gallop.  Pulmonary:     Effort: Pulmonary effort is normal. No tachypnea or respiratory distress.     Breath sounds: Normal breath sounds. No decreased breath sounds, wheezing, rhonchi or rales.  Chest:     Chest wall: No tenderness.  Abdominal:     General: Bowel sounds are normal.     Palpations: Abdomen is soft.  Musculoskeletal:        General: Normal range of motion.     Cervical back: Normal range of motion.  Skin:    General: Skin is warm and dry.  Neurological:     Mental Status: She is alert and oriented to person, place, and time.     Coordination: Coordination normal.  Psychiatric:        Behavior: Behavior normal. Behavior is cooperative.        Thought Content: Thought content normal.        Judgment: Judgment normal.          Patient has been counseled extensively about nutrition and exercise as well as the importance of adherence with medications and regular follow-up. The patient was given clear instructions to go to ER or return to medical center if symptoms don't improve, worsen or new problems develop. The patient verbalized understanding.   Follow-up: Return if symptoms worsen or fail to improve.   Collins Dean, FNP-BC [facility] [place], [place] [phone]   12/10/2023, 4:04 PM

## 2023-12-10 NOTE — Telephone Encounter (Signed)
  Chief Complaint: flank pain, painful urination Symptoms: feels feverish, burning with urination Frequency: x 4 days Pertinent Negatives: Patient denies blood in urine Disposition: [] ED /[] Urgent Care (no appt availability in office) / [x] Appointment(In office/virtual)/ []  [facility]/ [] Home Care/ [] Refused Recommended Disposition /[] [facility]/ []  Follow-up with PCP Additional Notes: Painful urination, increased frequency, flank pain, possible fever x 4 days Appointment scheduled Copied from CRM [id number]. Topic: Clinical - Red Word Triage >> Dec 10, 2023  1:49 PM Carlatta H wrote: [facility] that prompted transfer to Nurse Triage: Patient has been feeling dizziness, back pain on the left side and fatigue for about 4 days Reason for Disposition  [1] SEVERE pain with urination (e.g., excruciating) AND [2] not improved after 2 hours of pain medicine and Sitz bath  Answer Assessment - Initial Assessment Questions 1. SYMPTOM: "What's the main symptom you're concerned about?" (e.g., frequency, incontinence)     Flank pain 2. ONSET: "When did the  pain  start?"     Four days 3. PAIN: "Is there any pain?" If Yes, ask: "How bad is it?" (Scale: 1-10; mild, moderate, severe)     yes 4. CAUSE: "What do you think is causing the symptoms?"     Possible UTI 5. OTHER SYMPTOMS: "Do you have any other symptoms?" (e.g., blood in urine, fever, flank pain, pain with urination)     Left flank pain, urinary urgency and frequency, fever  Protocols used: Urinary Symptoms-A-AH, Urination Pain - Female-A-AH

## 2023-12-10 NOTE — Telephone Encounter (Signed)
 Noted

## 2023-12-11 ENCOUNTER — Ambulatory Visit: Payer: Self-pay | Admitting: Nurse Practitioner

## 2023-12-11 LAB — CERVICOVAGINAL ANCILLARY ONLY
Bacterial Vaginitis (gardnerella): NEGATIVE
Candida Glabrata: NEGATIVE
Candida Vaginitis: NEGATIVE
Chlamydia: NEGATIVE
Comment: NEGATIVE
Comment: NEGATIVE
Comment: NEGATIVE
Comment: NEGATIVE
Comment: NEGATIVE
Comment: NORMAL
Neisseria Gonorrhea: NEGATIVE
Trichomonas: NEGATIVE
